# Patient Record
Sex: Male | Born: 1960 | Hispanic: No | Marital: Married | Smoking: Current every day smoker
Health system: Southern US, Community
[De-identification: ages and names within clinical notes are randomized; demographics above are authoritative.]

## PROBLEM LIST (undated history)

## (undated) DIAGNOSIS — I10 Essential (primary) hypertension: Secondary | ICD-10-CM

## (undated) DIAGNOSIS — N210 Calculus in bladder: Secondary | ICD-10-CM

## (undated) DIAGNOSIS — R319 Hematuria, unspecified: Secondary | ICD-10-CM

## (undated) DIAGNOSIS — N529 Male erectile dysfunction, unspecified: Secondary | ICD-10-CM

## (undated) DIAGNOSIS — Z87442 Personal history of urinary calculi: Secondary | ICD-10-CM

## (undated) DIAGNOSIS — R21 Rash and other nonspecific skin eruption: Secondary | ICD-10-CM

## (undated) DIAGNOSIS — N200 Calculus of kidney: Secondary | ICD-10-CM

## (undated) DIAGNOSIS — Z8679 Personal history of other diseases of the circulatory system: Secondary | ICD-10-CM

## (undated) DIAGNOSIS — R350 Frequency of micturition: Secondary | ICD-10-CM

## (undated) DIAGNOSIS — R300 Dysuria: Secondary | ICD-10-CM

## (undated) HISTORY — PX: EXTRACORPOREAL SHOCK WAVE LITHOTRIPSY: SHX1557

## (undated) HISTORY — PX: ORCHIECTOMY: SHX2116

---

## 1999-04-06 ENCOUNTER — Other Ambulatory Visit: Admission: RE | Admit: 1999-04-06 | Discharge: 1999-04-06 | Payer: Self-pay | Admitting: Gynecology

## 2002-04-09 ENCOUNTER — Ambulatory Visit (HOSPITAL_COMMUNITY): Admission: RE | Admit: 2002-04-09 | Discharge: 2002-04-09 | Payer: Self-pay | Admitting: Urology

## 2002-04-09 ENCOUNTER — Encounter: Payer: Self-pay | Admitting: Urology

## 2002-04-11 ENCOUNTER — Ambulatory Visit (HOSPITAL_BASED_OUTPATIENT_CLINIC_OR_DEPARTMENT_OTHER): Admission: RE | Admit: 2002-04-11 | Discharge: 2002-04-11 | Payer: Self-pay | Admitting: Urology

## 2002-04-11 ENCOUNTER — Encounter: Payer: Self-pay | Admitting: Urology

## 2002-05-13 ENCOUNTER — Ambulatory Visit (HOSPITAL_BASED_OUTPATIENT_CLINIC_OR_DEPARTMENT_OTHER): Admission: RE | Admit: 2002-05-13 | Discharge: 2002-05-13 | Payer: Self-pay | Admitting: Urology

## 2002-05-13 ENCOUNTER — Encounter: Payer: Self-pay | Admitting: Urology

## 2002-07-22 ENCOUNTER — Encounter: Payer: Self-pay | Admitting: Urology

## 2002-07-22 ENCOUNTER — Ambulatory Visit (HOSPITAL_COMMUNITY): Admission: RE | Admit: 2002-07-22 | Discharge: 2002-07-22 | Payer: Self-pay | Admitting: Urology

## 2002-09-25 ENCOUNTER — Other Ambulatory Visit: Admission: RE | Admit: 2002-09-25 | Discharge: 2002-09-25 | Payer: Self-pay | Admitting: Gynecology

## 2011-02-18 ENCOUNTER — Ambulatory Visit (INDEPENDENT_AMBULATORY_CARE_PROVIDER_SITE_OTHER): Payer: Self-pay

## 2011-02-18 ENCOUNTER — Inpatient Hospital Stay (INDEPENDENT_AMBULATORY_CARE_PROVIDER_SITE_OTHER)
Admission: RE | Admit: 2011-02-18 | Discharge: 2011-02-18 | Disposition: A | Discharge status: Home / Self Care | Payer: Self-pay | Source: Ambulatory Visit | Attending: Family Medicine | Admitting: Family Medicine

## 2011-02-18 DIAGNOSIS — N200 Calculus of kidney: Secondary | ICD-10-CM

## 2011-02-18 LAB — POCT URINALYSIS DIP (DEVICE)
Glucose, UA: NEGATIVE mg/dL
Ketones, ur: NEGATIVE mg/dL
Leukocytes, UA: NEGATIVE
Nitrite: NEGATIVE
Protein, ur: 30 mg/dL — AB
Specific Gravity, Urine: 1.02 (ref 1.005–1.030)
Urobilinogen, UA: >=8 mg/dL (ref 0.0–1.0)
pH: 6.5 (ref 5.0–8.0)

## 2011-02-19 ENCOUNTER — Emergency Department (HOSPITAL_COMMUNITY)
Admission: EM | Admit: 2011-02-19 | Discharge: 2011-02-19 | Disposition: A | Discharge status: Home / Self Care | Payer: Self-pay | Attending: Emergency Medicine | Admitting: Emergency Medicine

## 2011-02-19 DIAGNOSIS — Z9889 Other specified postprocedural states: Secondary | ICD-10-CM | POA: Insufficient documentation

## 2011-02-19 DIAGNOSIS — R109 Unspecified abdominal pain: Secondary | ICD-10-CM | POA: Insufficient documentation

## 2011-02-19 DIAGNOSIS — N200 Calculus of kidney: Secondary | ICD-10-CM | POA: Insufficient documentation

## 2011-02-19 DIAGNOSIS — I10 Essential (primary) hypertension: Secondary | ICD-10-CM | POA: Insufficient documentation

## 2011-02-19 DIAGNOSIS — E7800 Pure hypercholesterolemia, unspecified: Secondary | ICD-10-CM | POA: Insufficient documentation

## 2011-02-23 ENCOUNTER — Other Ambulatory Visit (HOSPITAL_COMMUNITY): Payer: Self-pay | Admitting: Urology

## 2011-02-23 DIAGNOSIS — N200 Calculus of kidney: Secondary | ICD-10-CM

## 2011-02-24 ENCOUNTER — Ambulatory Visit (HOSPITAL_COMMUNITY)
Admission: RE | Admit: 2011-02-24 | Discharge: 2011-02-24 | Disposition: A | Discharge status: Home / Self Care | Payer: Self-pay | Source: Ambulatory Visit | Attending: Urology | Admitting: Urology

## 2011-02-24 DIAGNOSIS — N200 Calculus of kidney: Secondary | ICD-10-CM | POA: Insufficient documentation

## 2011-02-24 DIAGNOSIS — K4090 Unilateral inguinal hernia, without obstruction or gangrene, not specified as recurrent: Secondary | ICD-10-CM | POA: Insufficient documentation

## 2011-02-24 DIAGNOSIS — N400 Enlarged prostate without lower urinary tract symptoms: Secondary | ICD-10-CM | POA: Insufficient documentation

## 2011-02-24 DIAGNOSIS — R109 Unspecified abdominal pain: Secondary | ICD-10-CM | POA: Insufficient documentation

## 2011-02-24 DIAGNOSIS — R509 Fever, unspecified: Secondary | ICD-10-CM | POA: Insufficient documentation

## 2011-02-24 DIAGNOSIS — R110 Nausea: Secondary | ICD-10-CM | POA: Insufficient documentation

## 2011-02-24 DIAGNOSIS — K5730 Diverticulosis of large intestine without perforation or abscess without bleeding: Secondary | ICD-10-CM | POA: Insufficient documentation

## 2011-02-24 DIAGNOSIS — N1330 Unspecified hydronephrosis: Secondary | ICD-10-CM | POA: Insufficient documentation

## 2011-02-24 DIAGNOSIS — N201 Calculus of ureter: Secondary | ICD-10-CM | POA: Insufficient documentation

## 2011-03-03 ENCOUNTER — Ambulatory Visit (HOSPITAL_COMMUNITY)
Admission: RE | Admit: 2011-03-03 | Discharge: 2011-03-03 | Disposition: A | Discharge status: Home / Self Care | Payer: Self-pay | Source: Ambulatory Visit | Attending: Urology | Admitting: Urology

## 2011-03-07 ENCOUNTER — Ambulatory Visit (HOSPITAL_COMMUNITY)
Admission: RE | Admit: 2011-03-07 | Discharge: 2011-03-07 | Disposition: A | Discharge status: Home / Self Care | Payer: Self-pay | Source: Ambulatory Visit | Attending: Urology | Admitting: Urology

## 2011-03-07 ENCOUNTER — Ambulatory Visit (HOSPITAL_COMMUNITY): Payer: Self-pay

## 2011-03-07 DIAGNOSIS — Z01818 Encounter for other preprocedural examination: Secondary | ICD-10-CM | POA: Insufficient documentation

## 2011-03-07 DIAGNOSIS — N201 Calculus of ureter: Secondary | ICD-10-CM | POA: Insufficient documentation

## 2011-03-07 DIAGNOSIS — I10 Essential (primary) hypertension: Secondary | ICD-10-CM | POA: Insufficient documentation

## 2011-03-07 DIAGNOSIS — F17200 Nicotine dependence, unspecified, uncomplicated: Secondary | ICD-10-CM | POA: Insufficient documentation

## 2011-03-07 DIAGNOSIS — E7800 Pure hypercholesterolemia, unspecified: Secondary | ICD-10-CM | POA: Insufficient documentation

## 2011-03-07 DIAGNOSIS — Z79899 Other long term (current) drug therapy: Secondary | ICD-10-CM | POA: Insufficient documentation

## 2011-03-07 DIAGNOSIS — Z7982 Long term (current) use of aspirin: Secondary | ICD-10-CM | POA: Insufficient documentation

## 2012-09-03 IMAGING — CT CT ABD-PELV W/O CM
2 of 3 series · 17 of 46 positions shown, 19 images · non-contrast
Comparison: None

CLINICAL DATA: Left flank pain for 1.5 weeks, nausea, fever,
question kidney stones

CT ABDOMEN AND PELVIS WITHOUT CONTRAST
TECHNIQUE: Multidetector CT imaging of the abdomen and pelvis was
performed following the standard protocol without intravenous
contrast. Sagittal and coronal MPR images reconstructed from axial
data set.

[Series 2: under 200# stone no prev · axial · 0.71mm/px · z∈[-374,+40]mm · 14 of 97 slices shown, 16 images]
[im 7/97  soft-tissue]
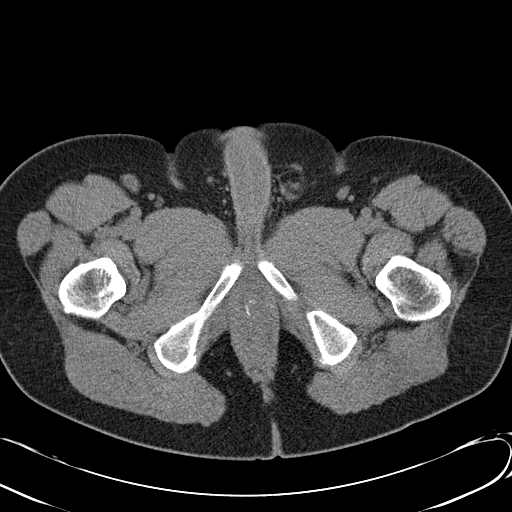
[im 7/97  bone]
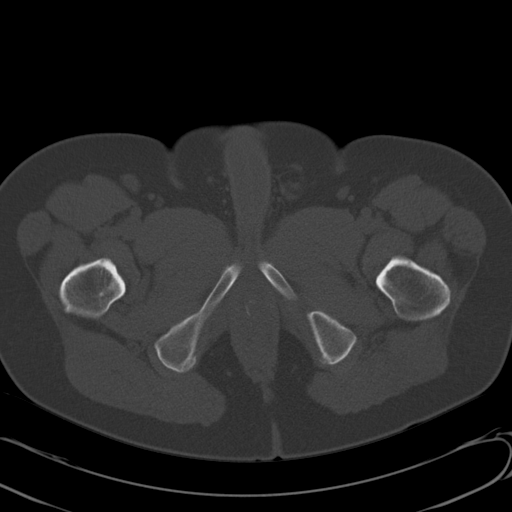
[im 13/97  soft-tissue]
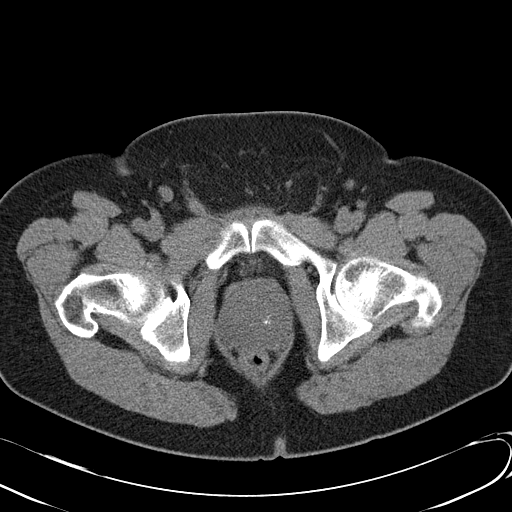
[im 19/97  soft-tissue]
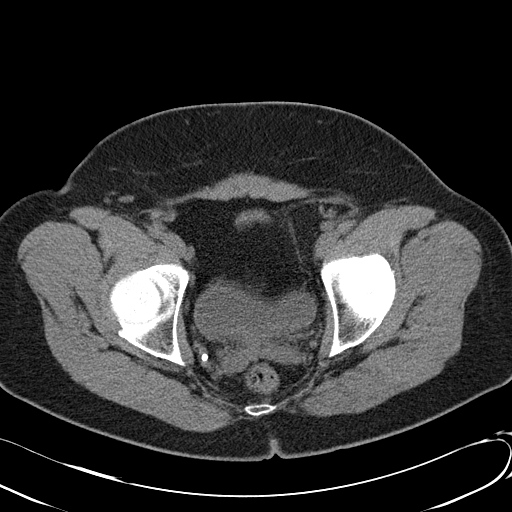
[im 25/97  soft-tissue]
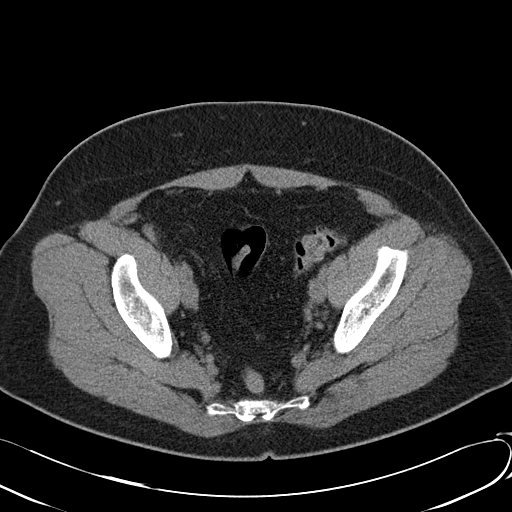
[im 31/97  soft-tissue]
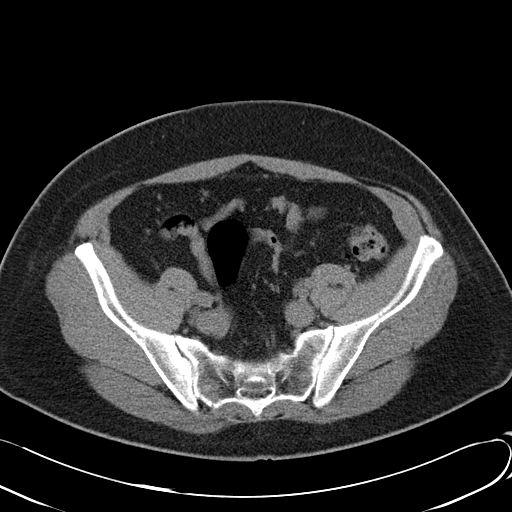
[im 38/97  soft-tissue]
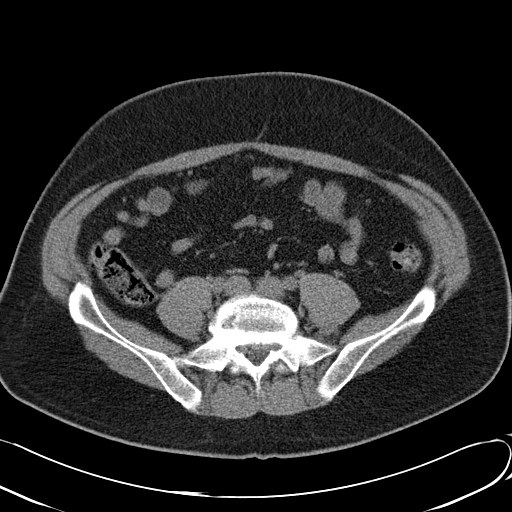
[im 44/97  soft-tissue]
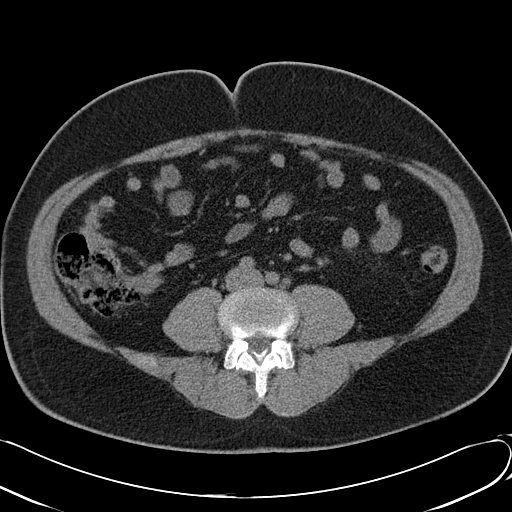
[im 53/97  soft-tissue]
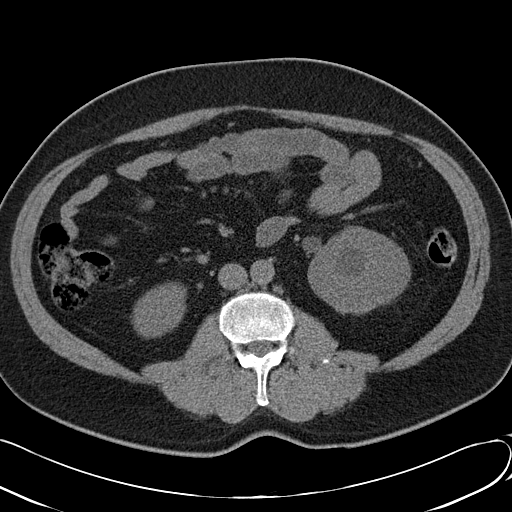
[im 59/97  soft-tissue]
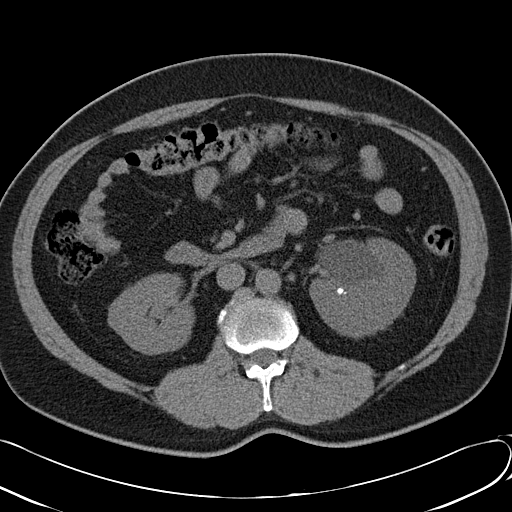
[im 59/97  bone]
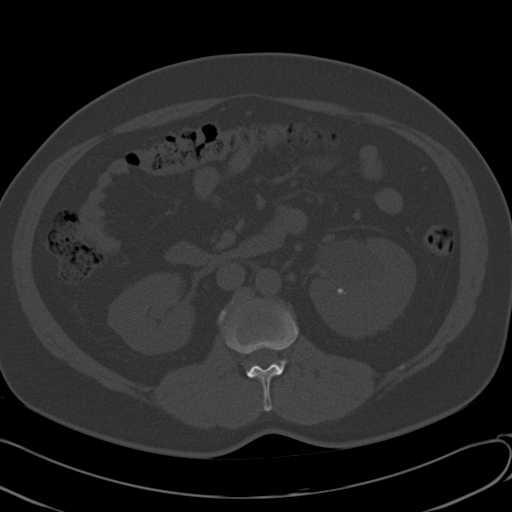
[im 66/97  soft-tissue]
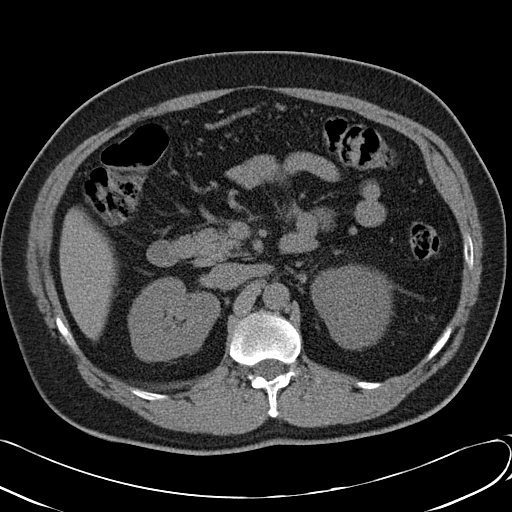
[im 72/97  soft-tissue]
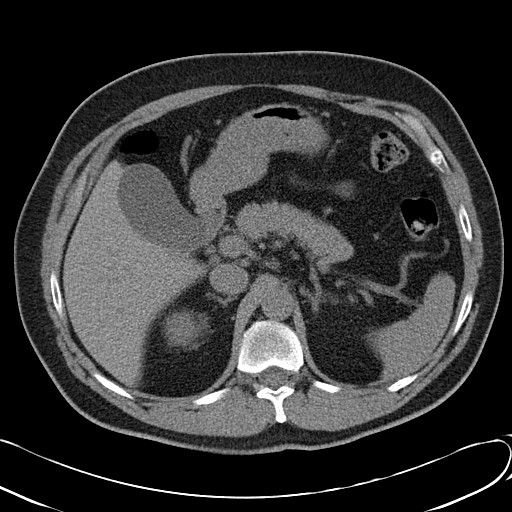
[im 78/97  soft-tissue]
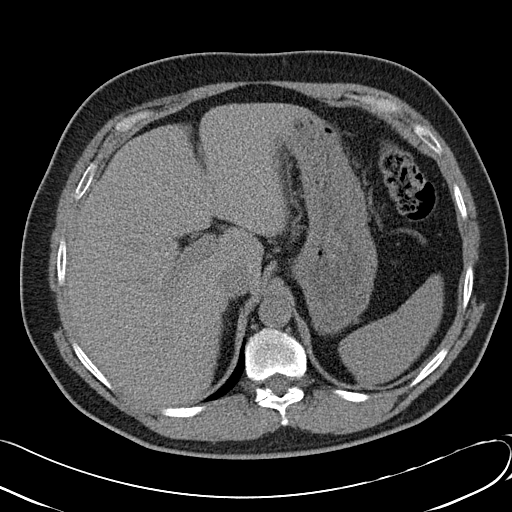
[im 84/97  soft-tissue]
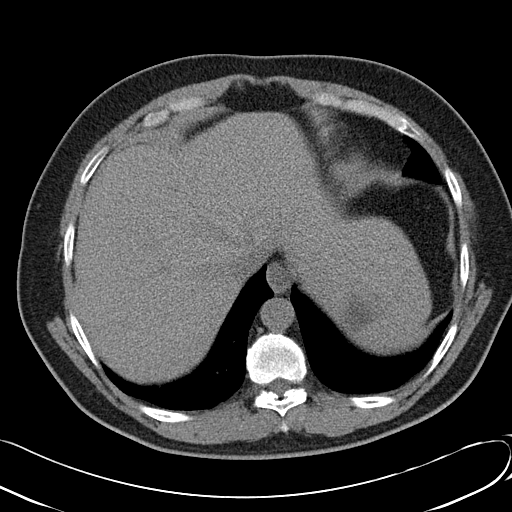
[im 90/97  soft-tissue]
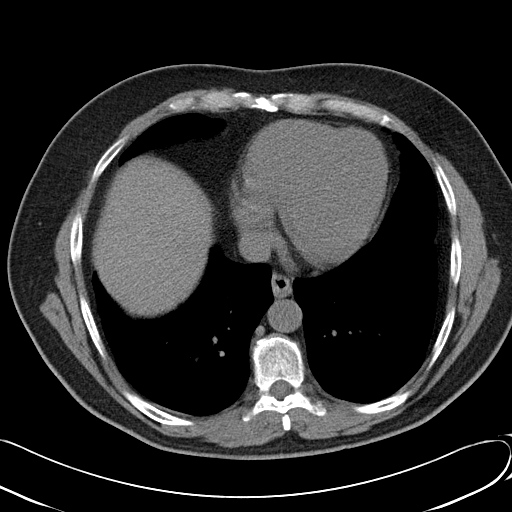

[Series 602: <mpr thick range> · coronal · 0.94mm/px · 3 of 94 slices shown]
[im 32/94  soft-tissue]
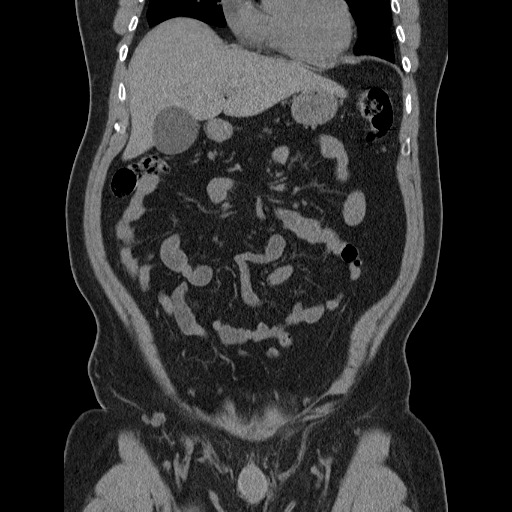
[im 42/94  soft-tissue]
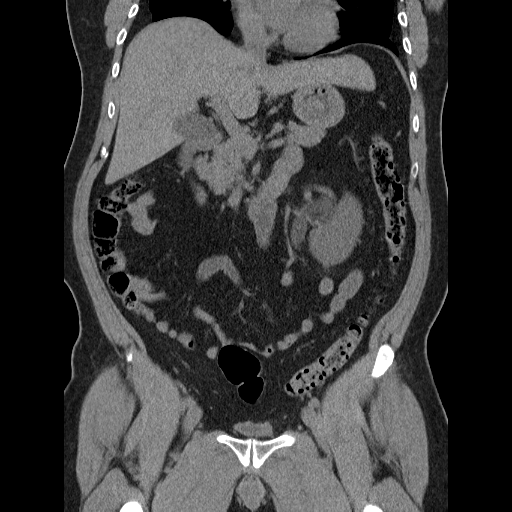
[im 52/94  soft-tissue]
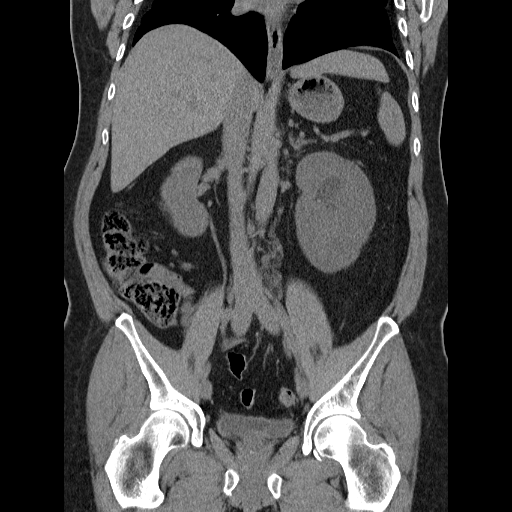

[17 of 46 positions shown; findings below may reference images not displayed]

FINDINGS: Lung bases clear.
Numerous bilateral renal calculi up to 9 mm diameter at lower pole
right kidney.
Left hydronephrosis secondary to a 7 x 5 mm diameter 11 mm length
very proximal left ureteral calculus.
No ureteral dilatation or right hydronephrosis.
Prostatic enlargement, gland measuring 5.3 x 4.7 cm image 85,
cm length.
Low attenuation focus within medial mid to upper right kidney,
x 1.1 cm image 33, likely small cyst.
No focal bladder abnormalities.

Within limits of a nonenhanced exam, liver, spleen, pancreas, and
adrenal glands normal.
Appendix normal appearance on coronal images.
Diverticulosis of descending and sigmoid colon without
diverticulitis.
Left inguinal hernia containing fat.
Stomach and bowel loops otherwise unremarkable.
No mass, adenopathy, free fluid, or inflammatory process.
No acute osseous findings.
IMPRESSION: Numerous bilateral renal calculi.
Left hydronephrosis secondary to an 11 x 7 x 5 mm calculus in the
very proximal left ureter.
Left inguinal hernia containing fat.
Probable small right renal cyst.
Descending and sigmoid colonic diverticulosis.
Prostatic enlargement.

## 2013-03-28 ENCOUNTER — Ambulatory Visit: Payer: Self-pay | Admitting: Family Medicine

## 2013-04-04 ENCOUNTER — Encounter: Payer: Self-pay | Admitting: Family Medicine

## 2013-04-04 ENCOUNTER — Ambulatory Visit (INDEPENDENT_AMBULATORY_CARE_PROVIDER_SITE_OTHER): Payer: No Typology Code available for payment source | Admitting: Family Medicine

## 2013-04-04 VITALS — BP 160/98 | HR 85 | Temp 98.3°F | Resp 16 | Ht 66.0 in | Wt 191.8 lb

## 2013-04-04 DIAGNOSIS — I10 Essential (primary) hypertension: Secondary | ICD-10-CM

## 2013-04-04 DIAGNOSIS — R21 Rash and other nonspecific skin eruption: Secondary | ICD-10-CM

## 2013-04-04 DIAGNOSIS — N529 Male erectile dysfunction, unspecified: Secondary | ICD-10-CM

## 2013-04-04 DIAGNOSIS — Z23 Encounter for immunization: Secondary | ICD-10-CM

## 2013-04-04 DIAGNOSIS — Z0000 Encounter for general adult medical examination without abnormal findings: Secondary | ICD-10-CM

## 2013-04-04 LAB — CBC
HCT: 45.1 % (ref 39.0–52.0)
Hemoglobin: 15.6 g/dL (ref 13.0–17.0)
MCH: 29.2 pg (ref 26.0–34.0)
MCHC: 34.6 g/dL (ref 30.0–36.0)
MCV: 84.3 fL (ref 78.0–100.0)
Platelets: 240 10*3/uL (ref 150–400)
RBC: 5.35 MIL/uL (ref 4.22–5.81)
RDW: 13.7 % (ref 11.5–15.5)
WBC: 9 10*3/uL (ref 4.0–10.5)

## 2013-04-04 LAB — LIPID PANEL
Cholesterol: 238 mg/dL — ABNORMAL HIGH (ref 0–200)
HDL: 52 mg/dL (ref >39)
LDL Cholesterol: 141 mg/dL — ABNORMAL HIGH (ref 0–99)
Total CHOL/HDL Ratio: 4.6 Ratio
Triglycerides: 223 mg/dL — ABNORMAL HIGH (ref <150)
VLDL: 45 mg/dL — ABNORMAL HIGH (ref 0–40)

## 2013-04-04 LAB — COMPREHENSIVE METABOLIC PANEL
ALT: 30 U/L (ref 0–53)
AST: 21 U/L (ref 0–37)
Albumin: 4.5 g/dL (ref 3.5–5.2)
Alkaline Phosphatase: 88 U/L (ref 39–117)
BUN: 17 mg/dL (ref 6–23)
CO2: 25 mEq/L (ref 19–32)
Calcium: 9.5 mg/dL (ref 8.4–10.5)
Chloride: 106 mEq/L (ref 96–112)
Creat: 0.74 mg/dL (ref 0.50–1.35)
Glucose, Bld: 93 mg/dL (ref 70–99)
Potassium: 4.1 mEq/L (ref 3.5–5.3)
Sodium: 139 mEq/L (ref 135–145)
Total Bilirubin: 0.4 mg/dL (ref 0.3–1.2)
Total Protein: 7.7 g/dL (ref 6.0–8.3)

## 2013-04-04 MED ORDER — KETOCONAZOLE 2 % EX CREA: TOPICAL_CREAM | Freq: Every day | CUTANEOUS | Status: DC

## 2013-04-04 MED ORDER — IVERMECTIN 3 MG PO TABS: 3.0000 mg | ORAL_TABLET | Freq: Once | ORAL | Status: DC

## 2013-04-04 MED ORDER — TRIAMCINOLONE ACETONIDE 0.5 % EX CREA: TOPICAL_CREAM | Freq: Two times a day (BID) | CUTANEOUS | Status: DC

## 2013-04-04 MED ORDER — AMLODIPINE BESYLATE 5 MG PO TABS: 5.0000 mg | ORAL_TABLET | Freq: Every day | ORAL | Status: DC

## 2013-04-04 MED ORDER — METHYLPREDNISOLONE (PAK) 4 MG PO TABS: ORAL_TABLET | ORAL | Status: DC

## 2013-04-04 MED ORDER — SILDENAFIL CITRATE 100 MG PO TABS: 50.0000 mg | ORAL_TABLET | Freq: Every day | ORAL | Status: DC | PRN

## 2013-04-04 NOTE — Progress Notes (Signed)
52 yo man with 5 years of rash, responds to kenalog 0.5% cream bid.  Very itchy, chest, arms, abdomen, groin extremities. This but a problem for the last 5 years and has been controlled by the Kenalog cream.  [employer] on [address].  Objective:  Patient is alert and in no acute distress but is inappropriately friendly Diffuse excoriated rash, especially the sternum. Both forearms reveals multiple papules and excoriations. Examination of the scrotum reveals some increased pigmentation which extends into the groin and perineum  Patient has an uncircumcised penis and only one testicle, having had right orchiectomy in the past.  Assessment: This chronic rash may in fact be scabies. It is unusual that his wife has had no rash.  Need for prophylactic vaccination and inoculation against influenza - Plan: Flu Vaccine QUAD 36+ mos IM  Rash - Plan: CBC, methylPREDNIsolone (MEDROL DOSPACK) 4 MG tablet, ivermectin (STROMECTOL) 3 MG TABS tablet, ketoconazole (NIZORAL) 2 % cream  Annual physical exam - Plan: CBC, Comprehensive metabolic panel, Lipid panel, PSA  Hypertension - Plan: amLODipine (NORVASC) 5 MG tablet  Erectile dysfunction - Plan: sildenafil (VIAGRA) 100 MG tablet  Follow up BP and rash in one month  Signed, Elvina Sidle, MD

## 2013-04-05 LAB — PSA: PSA: 2.33 ng/mL (ref <=4.00)

## 2013-04-25 ENCOUNTER — Ambulatory Visit (INDEPENDENT_AMBULATORY_CARE_PROVIDER_SITE_OTHER): Payer: No Typology Code available for payment source | Admitting: Family Medicine

## 2013-04-25 ENCOUNTER — Encounter: Payer: Self-pay | Admitting: Family Medicine

## 2013-04-25 VITALS — BP 171/95 | HR 79 | Temp 98.5°F | Resp 16 | Ht 66.5 in | Wt 190.6 lb

## 2013-04-25 DIAGNOSIS — R21 Rash and other nonspecific skin eruption: Secondary | ICD-10-CM

## 2013-04-25 DIAGNOSIS — Z0000 Encounter for general adult medical examination without abnormal findings: Secondary | ICD-10-CM

## 2013-04-25 DIAGNOSIS — I10 Essential (primary) hypertension: Secondary | ICD-10-CM

## 2013-04-25 LAB — CBC WITH DIFFERENTIAL/PLATELET
Basophils Absolute: 0 10*3/uL (ref 0.0–0.1)
Basophils Relative: 1 % (ref 0–1)
Eosinophils Absolute: 0.3 10*3/uL (ref 0.0–0.7)
Eosinophils Relative: 4 % (ref 0–5)
HCT: 44.5 % (ref 39.0–52.0)
Hemoglobin: 15.3 g/dL (ref 13.0–17.0)
Lymphocytes Relative: 26 % (ref 12–46)
Lymphs Abs: 2.2 10*3/uL (ref 0.7–4.0)
MCH: 29.1 pg (ref 26.0–34.0)
MCHC: 34.4 g/dL (ref 30.0–36.0)
MCV: 84.8 fL (ref 78.0–100.0)
Monocytes Absolute: 0.5 10*3/uL (ref 0.1–1.0)
Monocytes Relative: 6 % (ref 3–12)
Neutro Abs: 5.3 10*3/uL (ref 1.7–7.7)
Neutrophils Relative %: 63 % (ref 43–77)
Platelets: 224 10*3/uL (ref 150–400)
RBC: 5.25 MIL/uL (ref 4.22–5.81)
RDW: 14 % (ref 11.5–15.5)
WBC: 8.3 10*3/uL (ref 4.0–10.5)

## 2013-04-25 LAB — IFOBT (OCCULT BLOOD): IFOBT: NEGATIVE

## 2013-04-25 MED ORDER — TRIAMCINOLONE ACETONIDE 0.5 % EX CREA: TOPICAL_CREAM | Freq: Two times a day (BID) | CUTANEOUS | Status: DC

## 2013-04-25 MED ORDER — LOSARTAN POTASSIUM-HCTZ 100-12.5 MG PO TABS: 1.0000 | ORAL_TABLET | Freq: Every day | ORAL | Status: DC

## 2013-04-25 NOTE — Progress Notes (Signed)
Patient ID: Jackson Sullivan MRN: [id number], DOB: February 08, 1961 52 y.o. Date of Encounter: 04/25/2013, 11:49 AM  Primary Physician: No primary provider on file.  Chief Complaint: Physical (CPE)  HPI: 52 y.o. y/o male with history noted below here for CPE.  Doing well. Rash is improving in groin and lower extremities but persists on forearms.  Patient works at a [employer] on [employer]. His wife works in a [employer] and does [employer]. He has a daughter who is age 32 and is doing well in school.  Review of Systems: Consitutional: No fever, chills, fatigue, night sweats, lymphadenopathy, or weight changes. Eyes: No visual changes, eye redness, or discharge. ENT/Mouth: Ears: No otalgia, tinnitus, hearing loss, discharge. Nose: No congestion, rhinorrhea, sinus pain, or epistaxis. Throat: No sore throat, post nasal drip, or teeth pain. Cardiovascular: No CP, palpitations, diaphoresis, DOE, edema, orthopnea, PND. Respiratory: No cough, hemoptysis, SOB, or wheezing. Gastrointestinal: No anorexia, dysphagia, reflux, pain, nausea, vomiting, hematemesis, diarrhea, constipation, BRBPR, or melena. Genitourinary: No dysuria, frequency, urgency, hematuria, incontinence, nocturia, decreased urinary stream, discharge, impotence, or testicular pain/masses. Musculoskeletal: No decreased ROM, myalgias, stiffness, joint swelling, or weakness. Skin: No erythema, lesion changes, pain, warmth, jaundice, or pruritis. Neurological: No headache, dizziness, syncope, seizures, tremors, memory loss, coordination problems, or paresthesias. Psychological: No anxiety, depression, hallucinations, SI/HI. Endocrine: No fatigue, polydipsia, polyphagia, polyuria, or known diabetes. All other systems were reviewed and are otherwise negative.  No past medical history on file.   No past surgical history on file.  Home Meds:  Prior to Admission medications   Medication Sig Start Date End Date  Taking? Authorizing Provider  amLODipine (NORVASC) 5 MG tablet Take 1 tablet (5 mg total) by mouth daily. 04/04/13  Yes Elvina Sidle, MD  aspirin 81 MG tablet Take 81 mg by mouth daily.   Yes Historical Provider, MD  ivermectin (STROMECTOL) 3 MG TABS tablet Take 1 tablet (3 mg total) by mouth once. 04/04/13  Yes Elvina Sidle, MD  ketoconazole (NIZORAL) 2 % cream Apply topically daily. 04/04/13  Yes Elvina Sidle, MD  sildenafil (VIAGRA) 100 MG tablet Take 0.5-1 tablets (50-100 mg total) by mouth daily as needed for erectile dysfunction. 04/04/13  Yes Elvina Sidle, MD  triamcinolone cream (KENALOG) 0.5 % Apply topically 2 (two) times daily. 04/04/13  Yes Elvina Sidle, MD  methylPREDNIsolone (MEDROL DOSPACK) 4 MG tablet follow package directions 04/04/13   Elvina Sidle, MD    Allergies: No Known Allergies  History   Social History  . Marital Status: Married    Spouse Name: N/A    Number of Children: N/A  . Years of Education: N/A   Occupational History  . Not on file.   Social History Main Topics  . Smoking status: Current Every Day Smoker    Types: Cigarettes  . Smokeless tobacco: Not on file  . Alcohol Use: Not on file  . Drug Use: Not on file  . Sexual Activity: Not on file   Other Topics Concern  . Not on file   Social History Narrative  . No narrative on file    No family history on file.  Physical Exam: Blood pressure 171/95, pulse 79, temperature 98.5 F (36.9 C), temperature source Oral, resp. rate 16, height 5' 6.5" (1.689 m), weight 190 lb 9.6 oz (86.456 kg), SpO2 99.00%.  General: Well developed, well nourished, in no acute distress. HEENT: Normocephalic, atraumatic. Conjunctiva pink, sclera non-icteric. Pupils 2 mm constricting to 1 mm, round, regular, and equally reactive  to light and accomodation. EOMI. Internal auditory canal clear. TMs with good cone of light and without pathology. Nasal mucosa pink. Nares are without discharge. No sinus  tenderness. Oral mucosa pink. Dentition fair. Pharynx without exudate.   Neck: Supple. Trachea midline. No thyromegaly. Full ROM. No lymphadenopathy. Lungs: Clear to auscultation bilaterally without wheezes, rales, or rhonchi. Breathing is of normal effort and unlabored. Cardiovascular: RRR with S1 S2. No murmurs, rubs, or gallops appreciated. Distal pulses 2+ symmetrically. No carotid or abdominal bruits Abdomen: Soft, non-tender, non-distended with normoactive bowel sounds. No hepatosplenomegaly or masses. No rebound/guarding. No CVA tenderness. Without hernias.  Rectal: No external hemorrhoids or fissures. Rectal vault without masses.  Genitourinary:  un circumcised male. No penile lesions. Testes descended bilaterally, and smooth without tenderness or masses.  Musculoskeletal: Full range of motion and 5/5 strength throughout. Without swelling, atrophy, tenderness, crepitus, or warmth. Extremities without clubbing, cyanosis, or edema. Calves supple. Skin: Warm and moist without erythema, ecchymosis, wounds, or rash. Neuro: A+Ox3. CN II-XII grossly intact. Moves all extremities spontaneously. Full sensation throughout. Normal gait. DTR 2+ throughout upper and lower extremities. Finger to nose intact. Psych:  Responds to questions appropriately with a normal affect.   Studies  UA:   Assessment/Plan:  52 y.o. y/o  male here for CPE Annual physical exam - Plan: CBC with Differential, Comprehensive metabolic panel, Lipid panel, PSA, Ambulatory referral to Gastroenterology, IFOBT POC (occult bld, rslt in office), CANCELED: POCT urinalysis dipstick  Rash - Plan: HIV Antibody, Ambulatory referral to Dermatology, triamcinolone cream (KENALOG) 0.5 %  Hypertension - Plan: losartan-hydrochlorothiazide (HYZAAR) 100-12.5 MG per tablet  Signed, Elvina Sidle, MD 04/25/2013 11:49 AM

## 2013-04-25 NOTE — Patient Instructions (Signed)

## 2013-04-26 LAB — COMPREHENSIVE METABOLIC PANEL
ALT: 26 U/L (ref 0–53)
AST: 20 U/L (ref 0–37)
Albumin: 4 g/dL (ref 3.5–5.2)
Alkaline Phosphatase: 84 U/L (ref 39–117)
BUN: 15 mg/dL (ref 6–23)
CO2: 25 mEq/L (ref 19–32)
Calcium: 9 mg/dL (ref 8.4–10.5)
Chloride: 106 mEq/L (ref 96–112)
Creat: 0.72 mg/dL (ref 0.50–1.35)
Glucose, Bld: 110 mg/dL — ABNORMAL HIGH (ref 70–99)
Potassium: 3.8 mEq/L (ref 3.5–5.3)
Sodium: 138 mEq/L (ref 135–145)
Total Bilirubin: 0.5 mg/dL (ref 0.3–1.2)
Total Protein: 6.9 g/dL (ref 6.0–8.3)

## 2013-04-26 LAB — LIPID PANEL
Cholesterol: 245 mg/dL — ABNORMAL HIGH (ref 0–200)
HDL: 57 mg/dL (ref >39)
LDL Cholesterol: 158 mg/dL — ABNORMAL HIGH (ref 0–99)
Total CHOL/HDL Ratio: 4.3 Ratio
Triglycerides: 150 mg/dL — ABNORMAL HIGH (ref <150)
VLDL: 30 mg/dL (ref 0–40)

## 2013-04-26 LAB — PSA: PSA: 2.49 ng/mL (ref <=4.00)

## 2013-04-26 LAB — HIV ANTIBODY (ROUTINE TESTING W REFLEX): HIV: NONREACTIVE

## 2013-05-14 ENCOUNTER — Other Ambulatory Visit: Payer: Self-pay | Admitting: Gastroenterology

## 2013-05-17 ENCOUNTER — Encounter (HOSPITAL_COMMUNITY): Payer: Self-pay | Admitting: *Deleted

## 2013-05-17 ENCOUNTER — Ambulatory Visit (HOSPITAL_COMMUNITY)
Admission: RE | Admit: 2013-05-17 | Discharge: 2013-05-17 | Disposition: A | Discharge status: Home / Self Care | Payer: No Typology Code available for payment source | Source: Ambulatory Visit | Attending: Gastroenterology | Admitting: Gastroenterology

## 2013-05-17 ENCOUNTER — Encounter (HOSPITAL_COMMUNITY)
Admission: RE | Disposition: A | Discharge status: Home / Self Care | Payer: Self-pay | Source: Ambulatory Visit | Attending: Gastroenterology

## 2013-05-17 DIAGNOSIS — K644 Residual hemorrhoidal skin tags: Secondary | ICD-10-CM | POA: Insufficient documentation

## 2013-05-17 DIAGNOSIS — D126 Benign neoplasm of colon, unspecified: Secondary | ICD-10-CM | POA: Insufficient documentation

## 2013-05-17 DIAGNOSIS — K648 Other hemorrhoids: Secondary | ICD-10-CM | POA: Insufficient documentation

## 2013-05-17 DIAGNOSIS — F17200 Nicotine dependence, unspecified, uncomplicated: Secondary | ICD-10-CM | POA: Insufficient documentation

## 2013-05-17 DIAGNOSIS — I10 Essential (primary) hypertension: Secondary | ICD-10-CM | POA: Insufficient documentation

## 2013-05-17 DIAGNOSIS — Z1211 Encounter for screening for malignant neoplasm of colon: Secondary | ICD-10-CM | POA: Insufficient documentation

## 2013-05-17 HISTORY — PX: COLONOSCOPY: SHX5424

## 2013-05-17 HISTORY — DX: Essential (primary) hypertension: I10

## 2013-05-17 SURGERY — COLONOSCOPY
Anesthesia: Moderate Sedation

## 2013-05-17 MED ORDER — SODIUM CHLORIDE 0.9 % IV SOLN
INTRAVENOUS | Status: DC
  Administered 2013-05-17: 500 mL via INTRAVENOUS

## 2013-05-17 MED ORDER — MIDAZOLAM HCL 5 MG/5ML IJ SOLN
INTRAMUSCULAR | Status: DC | PRN
  Administered 2013-05-17 (×4): 2.5 mg via INTRAVENOUS

## 2013-05-17 MED ORDER — FENTANYL CITRATE 0.05 MG/ML IJ SOLN
INTRAMUSCULAR | Status: AC
  Filled 2013-05-17: qty 4

## 2013-05-17 MED ORDER — MIDAZOLAM HCL 10 MG/2ML IJ SOLN
INTRAMUSCULAR | Status: AC
  Filled 2013-05-17: qty 4

## 2013-05-17 MED ORDER — DIPHENHYDRAMINE HCL 50 MG/ML IJ SOLN
INTRAMUSCULAR | Status: AC
  Filled 2013-05-17: qty 1

## 2013-05-17 MED ORDER — FENTANYL CITRATE 0.05 MG/ML IJ SOLN
INTRAMUSCULAR | Status: DC | PRN
  Administered 2013-05-17 (×4): 25 ug via INTRAVENOUS

## 2013-05-17 NOTE — Op Note (Signed)
[facility] [address] [place], [address]   OPERATIVE PROCEDURE REPORT  PATIENT: Jackson Sullivan, Jackson Sullivan  MR#: [id number] BIRTHDATE: 1961/05/11  GENDER: Male ENDOSCOPIST: Jeani Hawking, MD ASSISTANT:   Dorisann Frames, technician Claudie Revering, RN CGRN PROCEDURE DATE: 05/17/2013 PROCEDURE:   Colonoscopy, screening ASA CLASS:   Class II INDICATIONS:Screening MEDICATIONS: Versed 10 mg IV and Fentanyl 100 mcg IV  DESCRIPTION OF PROCEDURE:   After the risks benefits and alternatives of the procedure were thoroughly explained, informed consent was obtained.  A digital rectal exam revealed no abnormalities of the rectum.    The     endoscope was introduced through the anus  and advanced to the cecum, which was identified by both the appendix and ileocecal valve , No adverse events experienced.    The quality of the prep was excellent. .  The instrument was then slowly withdrawn as the colon was fully examined.   FINDINGS: Three polyps were identified in the cecum, transverse colon, and sigmoid colon.  The polyps meausred 3 mm and all were removed with a cold snare.  No evidence of any masses, inflammation, ulcerations, erosions, or vascular abnormalities. The scope was then withdrawn from the patient and the procedure terminated.  COMPLICATIONS: There were no complications.  IMPRESSION: 1) Polyps. 2) Int/Ext Hemorrhoids.  RECOMMENDATIONS: 1) Await biopsy results. 2) Repeat the colonoscopy in 3-5 years.  _______________________________ eSignedJeani Hawking, MD 05/17/2013 11:38 AM

## 2013-05-17 NOTE — H&P (Signed)
Reason for Consult: Screening colonoscopy Referring Physician: Elvina Sidle, M.D.  Nagi Awan HPI: This is a 51 year old male who is referred for a screening colonoscopy.  He denies any issues with his colon, but he does report having some chest pain.  In the office, it was felt that his chest pain was not cardiac.  No issues with his chest pain with exertion and it was not long lasting.  Even though it does not appear that he has cardiac chest pain, the decision was made to bring him to the hospital.  Past Medical History  Diagnosis Date  . Hypertension   . Fungal infection     Past Surgical History  Procedure Laterality Date  . Testicle surgery      History reviewed. No pertinent family history.  Social History:  reports that he has been smoking Cigarettes.  He has been smoking about 0.00 packs per day. He does not have any smokeless tobacco history on file. He reports that he does not drink alcohol or use illicit drugs.  Allergies: No Known Allergies  Medications:  Scheduled:  Continuous: . sodium chloride      No results found for this or any previous visit (from the past 24 hour(s)).   No results found.  ROS:  As stated above in the HPI otherwise negative.  There were no vitals taken for this visit.    PE: Gen: NAD, Alert and Oriented HEENT:  [place]/AT, EOMI Neck: Supple, no LAD Lungs: CTA Bilaterally CV: RRR without M/G/R ABM: Soft, NTND, +BS Ext: No C/C/E  Assessment/Plan: 1) Screening colonoscopy today.  Imir Brumbach D 05/17/2013, 10:08 AM

## 2013-05-20 ENCOUNTER — Encounter (HOSPITAL_COMMUNITY): Payer: Self-pay | Admitting: Gastroenterology

## 2013-08-08 ENCOUNTER — Ambulatory Visit (INDEPENDENT_AMBULATORY_CARE_PROVIDER_SITE_OTHER): Payer: No Typology Code available for payment source | Admitting: Family Medicine

## 2013-08-08 ENCOUNTER — Encounter: Payer: Self-pay | Admitting: Family Medicine

## 2013-08-08 VITALS — BP 120/82 | HR 62 | Temp 98.3°F | Resp 16 | Ht 65.0 in | Wt 192.0 lb

## 2013-08-08 DIAGNOSIS — R21 Rash and other nonspecific skin eruption: Secondary | ICD-10-CM

## 2013-08-08 DIAGNOSIS — N529 Male erectile dysfunction, unspecified: Secondary | ICD-10-CM

## 2013-08-08 MED ORDER — TRIAMCINOLONE ACETONIDE 0.5 % EX CREA: TOPICAL_CREAM | Freq: Two times a day (BID) | CUTANEOUS | Status: DC

## 2013-08-08 NOTE — Progress Notes (Signed)
° °  Subjective:  This chart was scribed for Robyn Haber, MD by Donato Schultz, Medical Scribe. This patient was seen in Room 27 and the patient's care was started at 10:58 AM.   Patient ID: Jackson Sullivan, male    DOB: 13-Jun-1960, 53 y.o.   MRN: [id number]  HPI HPI Comments: Jackson Sullivan is a 53 y.o. male who presents to the [facility] for a follow-up visit regarding a rash on his arms bilaterally and bilateral legs.  He states that the rash has improved but is still painful.  The patient states that the pharmacy gave him the 0.1% container of Kenalog cream but he would like the 1% instead.  The patient states that he works at a [employer] on [address].      Past Medical History  Diagnosis Date   Hypertension    Fungal infection    Past Surgical History  Procedure Laterality Date   Testicle surgery     Colonoscopy N/A 05/17/2013    Procedure: COLONOSCOPY;  Surgeon: Beryle Beams, MD;  Location: [facility];  Service: Endoscopy;  Laterality: N/A;   No family history on file. History   Social History   Marital Status: Married    Spouse Name: N/A    Number of Children: N/A   Years of Education: N/A   Occupational History   Not on file.   Social History Main Topics   Smoking status: Current Every Day Smoker    Types: Cigarettes   Smokeless tobacco: Not on file   Alcohol Use: No   Drug Use: No   Sexual Activity: Not on file   Other Topics Concern   Not on file   Social History Narrative   No narrative on file   No Known Allergies  Review of Systems  Skin: Positive for rash.  All other systems reviewed and are negative.     Objective:  Physical Exam  Nursing note and vitals reviewed. Constitutional: He is oriented to person, place, and time. He appears well-developed and well-nourished.  HENT:  Head: Normocephalic and atraumatic.  Eyes: EOM are normal.  Neck: Normal range of motion.  Cardiovascular: Normal  rate, regular rhythm and normal heart sounds.  Exam reveals no gallop and no friction rub.   No murmur heard. Pulmonary/Chest: Effort normal and breath sounds normal.  Musculoskeletal: Normal range of motion.  Neurological: He is alert and oriented to person, place, and time.  Skin: Skin is warm and dry.  Healing hyperpigmented areas on forearms and shins. There is some fresh excoriations on the left shin but otherwise the skin is healing.  Psychiatric: He has a normal mood and affect. His behavior is normal.       There were no vitals taken for this visit. Assessment & Plan:   Rash - Plan: triamcinolone cream (KENALOG) 0.5 %  Erectile dysfunction I will try to obtain some coupons for the patient. Signed, Robyn Haber, MD   I personally performed the services described in this documentation, which was scribed in my presence. The recorded information has been reviewed and is accurate.

## 2013-08-09 ENCOUNTER — Telehealth: Payer: Self-pay

## 2013-08-09 MED ORDER — TRIAMCINOLONE ACETONIDE 0.1 % EX CREA: 1.0000 "application " | TOPICAL_CREAM | Freq: Two times a day (BID) | CUTANEOUS | Status: DC

## 2013-08-09 NOTE — Telephone Encounter (Signed)
Dr. Carlean Jews, pharmacy called and reported that pt states that he wants the 0.1% Kenalog cream, not the 0.5% which is what pt has always been on ( OV notes say that pt reported he has been using the 0.1% and pt wants 1% which pharm states is not made). 0.5% is what was sent in at [place]. Please advise.

## 2013-09-10 ENCOUNTER — Other Ambulatory Visit: Payer: Self-pay | Admitting: Dermatology

## 2014-02-13 ENCOUNTER — Encounter: Payer: Self-pay | Admitting: Family Medicine

## 2014-02-13 ENCOUNTER — Ambulatory Visit (INDEPENDENT_AMBULATORY_CARE_PROVIDER_SITE_OTHER): Payer: No Typology Code available for payment source | Admitting: Family Medicine

## 2014-02-13 VITALS — BP 149/85 | HR 75 | Temp 97.6°F | Resp 16 | Ht 66.75 in | Wt 186.4 lb

## 2014-02-13 DIAGNOSIS — R319 Hematuria, unspecified: Secondary | ICD-10-CM

## 2014-02-13 DIAGNOSIS — R21 Rash and other nonspecific skin eruption: Secondary | ICD-10-CM

## 2014-02-13 DIAGNOSIS — N200 Calculus of kidney: Secondary | ICD-10-CM

## 2014-02-13 DIAGNOSIS — I10 Essential (primary) hypertension: Secondary | ICD-10-CM

## 2014-02-13 DIAGNOSIS — N529 Male erectile dysfunction, unspecified: Secondary | ICD-10-CM

## 2014-02-13 LAB — COMPREHENSIVE METABOLIC PANEL
ALT: 23 U/L (ref 0–53)
AST: 22 U/L (ref 0–37)
Albumin: 4.9 g/dL (ref 3.5–5.2)
Alkaline Phosphatase: 85 U/L (ref 39–117)
BUN: 22 mg/dL (ref 6–23)
CO2: 27 mEq/L (ref 19–32)
Calcium: 9.9 mg/dL (ref 8.4–10.5)
Chloride: 101 mEq/L (ref 96–112)
Creat: 0.86 mg/dL (ref 0.50–1.35)
Glucose, Bld: 107 mg/dL — ABNORMAL HIGH (ref 70–99)
Potassium: 4.2 mEq/L (ref 3.5–5.3)
Sodium: 138 mEq/L (ref 135–145)
Total Bilirubin: 0.5 mg/dL (ref 0.2–1.2)
Total Protein: 8.3 g/dL (ref 6.0–8.3)

## 2014-02-13 LAB — POCT URINALYSIS DIPSTICK
Bilirubin, UA: NEGATIVE
Glucose, UA: NEGATIVE
Ketones, UA: NEGATIVE
Leukocytes, UA: NEGATIVE
Nitrite, UA: NEGATIVE
Protein, UA: 30
Spec Grav, UA: 1.02
Urobilinogen, UA: 1
pH, UA: 6

## 2014-02-13 LAB — LIPID PANEL
Cholesterol: 252 mg/dL — ABNORMAL HIGH (ref 0–200)
HDL: 65 mg/dL (ref >39)
LDL Cholesterol: 138 mg/dL — ABNORMAL HIGH (ref 0–99)
Total CHOL/HDL Ratio: 3.9 Ratio
Triglycerides: 245 mg/dL — ABNORMAL HIGH (ref <150)
VLDL: 49 mg/dL — ABNORMAL HIGH (ref 0–40)

## 2014-02-13 MED ORDER — TRIAMCINOLONE ACETONIDE 0.1 % EX CREA: 1.0000 "application " | TOPICAL_CREAM | Freq: Two times a day (BID) | CUTANEOUS | Status: DC

## 2014-02-13 MED ORDER — TRIAMCINOLONE ACETONIDE 0.5 % EX CREA: TOPICAL_CREAM | Freq: Two times a day (BID) | CUTANEOUS | Status: DC

## 2014-02-13 MED ORDER — TAMSULOSIN HCL 0.4 MG PO CAPS: 0.4000 mg | ORAL_CAPSULE | Freq: Every day | ORAL | Status: DC

## 2014-02-13 MED ORDER — HYDROCODONE-ACETAMINOPHEN 5-325 MG PO TABS: 1.0000 | ORAL_TABLET | Freq: Four times a day (QID) | ORAL | Status: DC | PRN

## 2014-02-13 MED ORDER — SILDENAFIL CITRATE 100 MG PO TABS
50.0000 mg | ORAL_TABLET | Freq: Every day | ORAL | Status: DC | PRN
Start: 2014-02-13 — End: 2015-12-24

## 2014-02-13 MED ORDER — LOSARTAN POTASSIUM-HCTZ 100-12.5 MG PO TABS: 1.0000 | ORAL_TABLET | Freq: Every day | ORAL | Status: DC

## 2014-02-13 NOTE — Progress Notes (Signed)
53 yo married Hispanic man who works in [employer] on [address] [employer].  He is here for refill of medications.  His main complaint is leg cramps, particularly at night.  He uses icy-hot OTC which helps.  He has had hypertension for over 10 years. He denies shortness of breath or  Chest pain.  No edema, either.  His chronic itchy rash in groins, lower legs, abdomen and forearms is well controlled with his triamcinolone cream.  He also has some scalp itching.  He reports recurrent kidney stones which he recently passed with some intense left groin and flank pain.  These run in his family with father and brother.  He has intermittent hematuria associated with the pain.  Objective:  NAD HEENT:  Unremarkable Chest:  Clear Heart:  Regular without murmur or gallop Skin: diffuse neurodermatitis excoriations.  Absent index fingernails bilaterally Ext:  No edema Genitalia:  Normal groins where rash was worst. Results for orders placed in visit on 02/13/14  POCT URINALYSIS DIPSTICK      Result Value Ref Range   Color, UA amber     Clarity, UA cloudy     Glucose, UA neg     Bilirubin, UA neg     Ketones, UA neg     Spec Grav, UA 1.020     Blood, UA large     pH, UA 6.0     Protein, UA 30     Urobilinogen, UA 1.0     Nitrite, UA neg     Leukocytes, UA Negative        Essential hypertension - Plan: losartan-hydrochlorothiazide (HYZAAR) 100-12.5 MG per tablet, Comprehensive metabolic panel, Lipid panel, POCT urinalysis dipstick  Erectile dysfunction, unspecified erectile dysfunction type - Plan: sildenafil (VIAGRA) 100 MG tablet  Kidney stones - Plan: tamsulosin (FLOMAX) 0.4 MG CAPS capsule, HYDROcodone-acetaminophen (NORCO) 5-325 MG per tablet, Comprehensive metabolic panel, POCT urinalysis dipstick  Rash - Plan: triamcinolone cream (KENALOG) 0.1 %, triamcinolone cream (KENALOG) 0.5 %  Signed, Robyn Haber, MD

## 2014-02-13 NOTE — Patient Instructions (Signed)
Clculos renales (Kidney Stones) Los clculos renales (urolitiasis) son masas slidas que se forman en el interior de los riones. El dolor intenso es causado por el movimiento de la piedra a travs del tracto urinario. Cuando la piedra se mueve, el urter hace un espasmo alrededor de la misma. El clculo generalmente se elimina con la orina.  CAUSAS   Un trastorno que hace que ciertas glndulas del cuello produzcan demasiada hormona paratiroidea (hiperparatiroidismo primario).  Una acumulacin de cristales de cido rico, similar a la gota en las articulaciones.  Estrechamiento (constriccin) del urter.  Obstruccin en el rin presente al nacer (obstruccin congnita).  Cirugas previas del rin o los urteres.  Numerosas infecciones renales. SNTOMAS   Ganas de vomitar (nuseas).  Devolver la comida (vomitar).  Sangre en la orina (hematuria).  Dolor que generalmente se expande (irradia) hacia la ingle.  Ganas de orinar con frecuencia o de manera urgente. DIAGNSTICO   Historia clnica y examen fsico.  Anlisis de sangre y orina.  Tomografa computada.  En algunos casos se realiza un examen del interior de la vejiga (citoscopa). TRATAMIENTO   Observacin.  Aumentar la ingesta de lquidos.  Litotricia extracorprea con ondas de choque: es un procedimiento no invasivo que utiliza ondas de choque para romper los clculos renales.  Ser necesaria la ciruga si tiene dolor muy intenso o la obstruccin persiste. Hay varios procedimientos quirrgicos. La mayora de los procedimientos se realizan con el uso de pequeos instrumentos. Slo es necesario realizar pequeas incisiones para acomodar estos instrumentos, por lo tanto el tiempo de recuperacin es mnimo. El tamao, la ubicacin y la composicin qumica de los clculos son variables importantes que determinarn la eleccin correcta de tratamiento para su caso. Comunquese con su mdico para comprender mejor su  situacin, de modo que pueda minimizar los riesgos de lesiones para usted y su rin.  INSTRUCCIONES PARA EL CUIDADO EN EL HOGAR   Beba gran cantidad de lquido para mantener la orina de tono claro o color amarillo plido. Esto ayudar a eliminar las piedras o los fragmentos.  Cuele la orina con el colador que le han provisto. Guarde todas las partculas y piedras para que las vea el profesional que lo asiste. Puede ser tan pequea como un grano de sal. Es muy importante usar el colador cada vez que orine. La recoleccin de piedras permitir al mdico analizar y verificar que efectivamente ha eliminado una piedra. El anlisis de la piedra con frecuencia permitir identificar qu puede hacer para reducir la incidencia de las recurrencias.  Slo tome medicamentos de venta libre o recetados para calmar el dolor, el malestar o bajar la fiebre, segn las indicaciones de su mdico.  Cumpla con las citas de seguimiento tal como le indic el profesional que lo asiste.  Si se lo indica, hgase radiografas. La ausencia de dolor no siempre significa que las piedras se han eliminado. Puede ser que simplemente hayan dejado de moverse. Si el paso de orina permanece completamente obstruido, puede causar prdida de la funcin renal o simplemente la destruccin del rin. Es su responsabilidad completar el seguimiento y las radiografas. Las ecografas del rin pueden mostrar una obstruccin y el estado del rin. Las ecografas no se asocian con la radiacin y pueden realizarse fcilmente en cuestin de minutos. SOLICITE ATENCIN MDICA SI:  Siente dolor que no responde a los analgsicos que le recetaron. SOLICITE ATENCIN MDICA DE INMEDIATO SI:   No puede controlar el dolor con los medicamentos que le han recetado.  Siente escalofros   o fiebre.  La gravedad o la intensidad del dolor aumenta durante 18 horas y no se Engineer, production con los analgsicos.  Presenta un nuevo episodio de dolor abdominal.  Sufre mareos  o se desmaya.  No puede orinar. ASEGRESE DE QUE:   Comprende estas instrucciones.  Controlar su afeccin.  Recibir ayuda de inmediato si no mejora o si empeora. Document Released: 05/23/2005 Document Revised: 01/23/2013 [facility] Patient Information 2015 [place]. This information is not intended to replace advice given to you by your health care provider. Make sure you discuss any questions you have with your health care provider. Hypertension Hypertension, commonly called high blood pressure, is when the force of blood pumping through your arteries is too strong. Your arteries are the blood vessels that carry blood from your heart throughout your body. A blood pressure reading consists of a higher number over a lower number, such as 110/72. The higher number (systolic) is the pressure inside your arteries when your heart pumps. The lower number (diastolic) is the pressure inside your arteries when your heart relaxes. Ideally you want your blood pressure below 120/80. Hypertension forces your heart to work harder to pump blood. Your arteries may become narrow or stiff. Having hypertension puts you at risk for heart disease, stroke, and other problems.  RISK FACTORS Some risk factors for high blood pressure are controllable. Others are not.  Risk factors you cannot control include:   Race. You may be at higher risk if you are African American.  Age. Risk increases with age.  Gender. Men are at higher risk than women before age 45 years. After age 23, women are at higher risk than men. Risk factors you can control include:  Not getting enough exercise or physical activity.  Being overweight.  Getting too much fat, sugar, calories, or salt in your diet.  Drinking too much alcohol. SIGNS AND SYMPTOMS Hypertension does not usually cause signs or symptoms. Extremely high blood pressure (hypertensive crisis) may cause headache, anxiety, shortness of breath, and  nosebleed. DIAGNOSIS  To check if you have hypertension, your health care provider will measure your blood pressure while you are seated, with your arm held at the level of your heart. It should be measured at least twice using the same arm. Certain conditions can cause a difference in blood pressure between your right and left arms. A blood pressure reading that is higher than normal on one occasion does not mean that you need treatment. If one blood pressure reading is high, ask your health care provider about having it checked again. TREATMENT  Treating high blood pressure includes making lifestyle changes and possibly taking medicine. Living a healthy lifestyle can help lower high blood pressure. You may need to change some of your habits. Lifestyle changes may include:  Following the DASH diet. This diet is high in fruits, vegetables, and whole grains. It is low in salt, red meat, and added sugars.  Getting at least 2 hours of brisk physical activity every week.  Losing weight if necessary.  Not smoking.  Limiting alcoholic beverages.  Learning ways to reduce stress. If lifestyle changes are not enough to get your blood pressure under control, your health care provider may prescribe medicine. You may need to take more than one. Work closely with your health care provider to understand the risks and benefits. HOME CARE INSTRUCTIONS  Have your blood pressure rechecked as directed by your health care provider.   Take medicines only as directed by your health care  provider. Follow the directions carefully. Blood pressure medicines must be taken as prescribed. The medicine does not work as well when you skip doses. Skipping doses also puts you at risk for problems.   Do not smoke.   Monitor your blood pressure at home as directed by your health care provider. SEEK MEDICAL CARE IF:   You think you are having a reaction to medicines taken.  You have recurrent headaches or feel  dizzy.  You have swelling in your ankles.  You have trouble with your vision. SEEK IMMEDIATE MEDICAL CARE IF:  You develop a severe headache or confusion.  You have unusual weakness, numbness, or feel faint.  You have severe chest or abdominal pain.  You vomit repeatedly.  You have trouble breathing. MAKE SURE YOU:   Understand these instructions.  Will watch your condition.  Will get help right away if you are not doing well or get worse. Document Released: 05/23/2005 Document Revised: 10/07/2013 Document Reviewed: 03/15/2013 [facility] Patient Information 2015 [place], [place]. This information is not intended to replace advice given to you by your health care provider. Make sure you discuss any questions you have with your health care provider.

## 2014-02-14 LAB — URINE CULTURE
Colony Count: NO GROWTH
Organism ID, Bacteria: NO GROWTH

## 2014-02-17 ENCOUNTER — Other Ambulatory Visit: Payer: Self-pay | Admitting: Family Medicine

## 2014-02-17 DIAGNOSIS — R319 Hematuria, unspecified: Secondary | ICD-10-CM

## 2014-04-08 ENCOUNTER — Other Ambulatory Visit: Payer: Self-pay | Admitting: Dermatology

## 2014-04-17 ENCOUNTER — Ambulatory Visit (INDEPENDENT_AMBULATORY_CARE_PROVIDER_SITE_OTHER): Payer: No Typology Code available for payment source | Admitting: Physician Assistant

## 2014-04-17 DIAGNOSIS — Z111 Encounter for screening for respiratory tuberculosis: Secondary | ICD-10-CM

## 2014-04-17 NOTE — Progress Notes (Signed)
Pt presents to clinic for a TB screening for work.  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

## 2014-04-17 NOTE — Progress Notes (Signed)
  Tuberculosis Risk Questionnaire  1. No Were you born outside the [place] in one of the following parts of the world: [place], [place], [place], [place] or [place]?    2. No Have you traveled outside the [place] and lived for more than one month in one of the following parts of the world: [place], [place], [place], [place] or [place]?    3. No Do you have a compromised immune system such as from any of the following conditions:HIV/AIDS, organ or bone marrow transplantation, diabetes, immunosuppressive medicines (e.g. Prednisone, Remicaide), leukemia, lymphoma, cancer of the head or neck, gastrectomy or jejunal bypass, end-stage renal disease (on dialysis), or silicosis?     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    5. No Have you ever: injected illegal drugs, used crack cocaine, lived in a homeless shelter  or been in jail or prison?     6. No Have you ever been exposed to anyone with infectious tuberculosis?    Tuberculosis Symptom Questionnaire  Do you currently have any of the following symptoms?  1. No Unexplained cough lasting more than 3 weeks?   2. No Unexplained fever lasting more than 3 weeks.   3. No Night Sweats (sweating that leaves the bedclothes and sheets wet)     4. No Shortness of Breath   5. No Chest Pain   6. No Unintentional weight loss    7. No Unexplained fatigue (very tired for no reason)

## 2014-04-20 ENCOUNTER — Ambulatory Visit (INDEPENDENT_AMBULATORY_CARE_PROVIDER_SITE_OTHER): Payer: No Typology Code available for payment source | Admitting: *Deleted

## 2014-04-20 DIAGNOSIS — Z111 Encounter for screening for respiratory tuberculosis: Secondary | ICD-10-CM

## 2014-04-20 LAB — TB SKIN TEST
INDURATION: 0 mm
TB SKIN TEST: NEGATIVE

## 2015-01-17 ENCOUNTER — Ambulatory Visit (INDEPENDENT_AMBULATORY_CARE_PROVIDER_SITE_OTHER): Payer: Worker's Compensation | Admitting: Emergency Medicine

## 2015-01-17 VITALS — BP 154/90 | HR 84 | Temp 98.2°F | Resp 16 | Ht 67.0 in | Wt 187.2 lb

## 2015-01-17 DIAGNOSIS — S46912A Strain of unspecified muscle, fascia and tendon at shoulder and upper arm level, left arm, initial encounter: Secondary | ICD-10-CM

## 2015-01-17 MED ORDER — NAPROXEN SODIUM 550 MG PO TABS: 550.0000 mg | ORAL_TABLET | Freq: Two times a day (BID) | ORAL | Status: DC

## 2015-01-17 MED ORDER — CYCLOBENZAPRINE HCL 10 MG PO TABS: 10.0000 mg | ORAL_TABLET | Freq: Three times a day (TID) | ORAL | Status: DC | PRN

## 2015-01-17 MED ORDER — HYDROCODONE-ACETAMINOPHEN 5-325 MG PO TABS: 1.0000 | ORAL_TABLET | ORAL | Status: DC | PRN

## 2015-01-17 NOTE — Patient Instructions (Signed)
Distensin muscular. (Muscle Strain) Una distensin muscular es una lesin que se produce cuando un msculo se estira ms all de su largo normal. Cuando esto sucede, por lo general se desgarra un pequeo nmero de fibras musculares. La distensin muscular se califica en grados. Las distensiones de Neurosurgeon son aquellas en las cuales el desgarro y el dolor afectan a la menor cantidad de fibras musculares. Las distensiones de segundo y tercer grado involucran una proporcin cada vez mayor de desgarro y Social research officer, government.  En general, la recuperacin de una distensin muscular tarda de 1 a 2semanas. La curacin completa tarda de 5 a 6semanas.  CAUSAS  Las distensiones musculares ocurren cuando se aplica una fuerza violenta y repentina sobre un msculo y este se estira demasiado. Esto puede ocurrir cuando se Magazine features editor, se practican deportes o en una cada.  [place] distensin muscular es especialmente comn en los atletas.  SIGNOS Y SNTOMAS En el lugar de la distensin muscular se puede presentar lo siguiente:  Dolor.  Moretones.  Hinchazn.  Dificultad para usar el msculo debido al dolor o a un funcionamiento anormal. DIAGNSTICO  El mdico le har un examen fsico y le har preguntas sobre sus antecedentes mdicos. TRATAMIENTO  Con frecuencia, el mejor tratamiento para una distensin muscular es el reposo, y la aplicacin de hielo y de compresas fras en la zona de la lesin.  INSTRUCCIONES PARA EL CUIDADO EN EL HOGAR   Use el mtodo PRICE (por sus siglas en ingls) de tratamiento para estimular la curacin durante los primeros 2 a 3das posteriores a la lesin. El mtodo PRICE implica lo siguiente:  Proteger al msculo de nuevas lesiones.  Limitar la actividad y Production assistant, radio la parte del cuerpo lesionada.  Aplicar hielo a la lesin. Para hacerlo, ponga hielo en una bolsa plstica. Coloque una toalla entre la piel y la bolsa de hielo. Luego aplique el hielo y djelo actuar  de 14 a 96minutos por hora. Despus del Building control surveyor, cambie a compresas de calor hmedo.  Comprimir la zona lesionada con una frula o venda elstica. Tenga cuidado de no ajustarla demasiado. Esto puede interferir con la circulacin sangunea o aumentar la hinchazn.  Mantener la zona lesionada por encima del nivel del corazn con la mayor frecuencia posible.  Utilice los medicamentos de venta libre o recetados para Glass blower/designer, el malestar o la fiebre, segn se lo indique el mdico.  Optometrist un calentamiento antes de hacer ejercicio ayuda a prevenir distensiones musculares futuras. SOLICITE ATENCIN MDICA SI:   Siente un dolor cada vez ms intenso o hinchazn en la zona lesionada.  Siente adormecimiento, hormigueo o nota una prdida importante de fuerza en la zona lesionada. ASEGRESE DE QUE:   Comprende estas instrucciones.  Controlar su afeccin.  Recibir ayuda de inmediato si no mejora o si empeora. Document Released: 03/02/2005 Document Revised: 03/13/2013 [facility] Patient Information 2015 [place]. This information is not intended to replace advice given to you by your health care provider. Make sure you discuss any questions you have with your health care provider.

## 2015-01-17 NOTE — Progress Notes (Signed)
Subjective:  Patient ID: Jackson Sullivan, male    DOB: [birth year]  Age: 54 y.o. MRN: [id number]  CC: workers comp   HPI [employer] presents  with an injury to his left shoulder. This morning he listed a pocket was on the stove was very heavy and he felt immediate pain in his left shoulder from the base the neck over the shoulder itself. He has some pain radiating into his arm with no numbness tingling or weakness he denies any direct injury. Has no history of prior neck or back injury. He's had no improvement with over-the-counter medication  History Review of Systems   Review of systems noncontributory  Objective:  BP 154/90 mmHg  Pulse 84  Temp(Src) 98.2 F (36.8 C) (Oral)  Resp 16  Ht 5\' 7"  (1.702 m)  Wt 187 lb 3.2 oz (84.913 kg)  BMI 29.31 kg/m2  SpO2 98%  Physical Exam  Constitutional: He is oriented to person, place, and time. He appears well-developed and well-nourished. No distress.  HENT:  Head: Normocephalic and atraumatic.  Right Ear: External ear normal.  Left Ear: External ear normal.  Nose: Nose normal.  Eyes: Conjunctivae and EOM are normal. Pupils are equal, round, and reactive to light. No scleral icterus.  Neck: Normal range of motion. Neck supple. No tracheal deviation present.  Cardiovascular: Normal rate, regular rhythm and normal heart sounds.   Pulmonary/Chest: Effort normal. No respiratory distress. He has no wheezes. He has no rales.  Abdominal: He exhibits no mass. There is no tenderness. There is no rebound and no guarding.  Musculoskeletal: He exhibits no edema.       Left shoulder: He exhibits decreased range of motion and tenderness.  Lymphadenopathy:    He has no cervical adenopathy.  Neurological: He is alert and oriented to person, place, and time. Coordination normal.  Skin: Skin is warm and dry. No rash noted.  Psychiatric: He has a normal mood and affect. His behavior is normal.    Review of his past medical family and social  history were normal  Assessment & Plan:   [place] was seen today for workers comp.  Diagnoses and all orders for this visit:  Shoulder strain, left, initial encounter  Other orders -     naproxen sodium (ANAPROX DS) 550 MG tablet; Take 1 tablet (550 mg total) by mouth 2 (two) times daily with a meal. -     cyclobenzaprine (FLEXERIL) 10 MG tablet; Take 1 tablet (10 mg total) by mouth 3 (three) times daily as needed for muscle spasms. -     HYDROcodone-acetaminophen (NORCO) 5-325 MG per tablet; Take 1-2 tablets by mouth every 4 (four) hours as needed.   I am having Mr. Hagmann start on naproxen sodium, cyclobenzaprine, and HYDROcodone-acetaminophen. I am also having him maintain his aspirin, losartan-hydrochlorothiazide, sildenafil, tamsulosin, triamcinolone cream, triamcinolone cream, and HYDROcodone-acetaminophen.  Meds ordered this encounter  Medications  . naproxen sodium (ANAPROX DS) 550 MG tablet    Sig: Take 1 tablet (550 mg total) by mouth 2 (two) times daily with a meal.    Dispense:  40 tablet    Refill:  0  . cyclobenzaprine (FLEXERIL) 10 MG tablet    Sig: Take 1 tablet (10 mg total) by mouth 3 (three) times daily as needed for muscle spasms.    Dispense:  30 tablet    Refill:  0  . HYDROcodone-acetaminophen (NORCO) 5-325 MG per tablet    Sig: Take 1-2 tablets by mouth every 4 (four)  hours as needed.    Dispense:  30 tablet    Refill:  0    Appropriate red flag conditions were discussed with the patient as well as actions that should be taken.  Patient expressed his understanding.  Follow-up: Return in 1 week (on 01/24/2015).  Roselee Culver, MD

## 2015-01-25 ENCOUNTER — Ambulatory Visit (INDEPENDENT_AMBULATORY_CARE_PROVIDER_SITE_OTHER): Payer: Worker's Compensation | Admitting: Emergency Medicine

## 2015-01-25 VITALS — BP 128/82 | HR 73 | Temp 98.3°F | Resp 14 | Ht 67.5 in | Wt 189.8 lb

## 2015-01-25 DIAGNOSIS — S46912D Strain of unspecified muscle, fascia and tendon at shoulder and upper arm level, left arm, subsequent encounter: Secondary | ICD-10-CM

## 2015-01-25 NOTE — Progress Notes (Signed)
   Subjective:  Patient ID: Daymon Hora, male    DOB: 1960/07/24  Age: 54 y.o. 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 He said the intervals pains got less and he has increased mobility. Still has pain with heavy lifting. He has no radicular symptoms or weakness. No neck pain   History   his past medical family and social history noncontributory  Review of Systems  Objective:  BP 128/82 mmHg  Pulse 73  Temp(Src) 98.3 F (36.8 C) (Oral)  Resp 14  Ht 5' 7.5" (1.715 m)  Wt 189 lb 12.8 oz (86.093 kg)  BMI 29.27 kg/m2  SpO2 98%  Physical Exam  Constitutional: He is oriented to person, place, and time. He appears well-developed and well-nourished.  HENT:  Head: Normocephalic and atraumatic.  Eyes: Conjunctivae are normal. Pupils are equal, round, and reactive to light.  Pulmonary/Chest: Effort normal.  Musculoskeletal: He exhibits no edema.  Neurological: He is alert and oriented to person, place, and time.  Skin: Skin is dry.  Psychiatric: He has a normal mood and affect. His behavior is normal. Thought content normal.   he has limitation of movement of his left shoulder. He can't reach around his back with his left hand. Is some discomfort with elevation of his arm     Assessment & Plan:   Maanav was seen today for follow-up.  Diagnoses and all orders for this visit:  Shoulder strain, left, subsequent encounter   I am having Mr. Krysiak maintain his aspirin, losartan-hydrochlorothiazide, sildenafil, tamsulosin, triamcinolone cream, triamcinolone cream, HYDROcodone-acetaminophen, naproxen sodium, cyclobenzaprine, and HYDROcodone-acetaminophen.  No orders of the defined types were placed in this encounter.   he was continued on light duty for another week will follow-up   Appropriate red flag conditions  were discussed with the patient as well as actions that should be taken.  Patient expressed his understanding.  Follow-up: No Follow-up on file.  Roselee Culver, MD

## 2015-01-25 NOTE — Patient Instructions (Signed)

## 2015-09-07 ENCOUNTER — Encounter (HOSPITAL_COMMUNITY): Payer: Self-pay | Admitting: Emergency Medicine

## 2015-09-07 ENCOUNTER — Emergency Department (HOSPITAL_COMMUNITY)
Admission: EM | Admit: 2015-09-07 | Discharge: 2015-09-07 | Disposition: A | Discharge status: Home / Self Care | Payer: 59 | Attending: Emergency Medicine | Admitting: Emergency Medicine

## 2015-09-07 ENCOUNTER — Emergency Department (HOSPITAL_COMMUNITY): Payer: 59

## 2015-09-07 DIAGNOSIS — Z7982 Long term (current) use of aspirin: Secondary | ICD-10-CM | POA: Diagnosis not present

## 2015-09-07 DIAGNOSIS — R102 Pelvic and perineal pain: Secondary | ICD-10-CM | POA: Insufficient documentation

## 2015-09-07 DIAGNOSIS — F17210 Nicotine dependence, cigarettes, uncomplicated: Secondary | ICD-10-CM | POA: Diagnosis not present

## 2015-09-07 DIAGNOSIS — R300 Dysuria: Secondary | ICD-10-CM | POA: Diagnosis not present

## 2015-09-07 DIAGNOSIS — I10 Essential (primary) hypertension: Secondary | ICD-10-CM | POA: Insufficient documentation

## 2015-09-07 DIAGNOSIS — Z8619 Personal history of other infectious and parasitic diseases: Secondary | ICD-10-CM | POA: Diagnosis not present

## 2015-09-07 DIAGNOSIS — Z87448 Personal history of other diseases of urinary system: Secondary | ICD-10-CM | POA: Insufficient documentation

## 2015-09-07 DIAGNOSIS — Z87442 Personal history of urinary calculi: Secondary | ICD-10-CM | POA: Diagnosis not present

## 2015-09-07 LAB — URINALYSIS, ROUTINE W REFLEX MICROSCOPIC
Bilirubin Urine: NEGATIVE
Glucose, UA: NEGATIVE mg/dL
Ketones, ur: NEGATIVE mg/dL
Nitrite: NEGATIVE
Protein, ur: 30 mg/dL — AB
Specific Gravity, Urine: 1.023 (ref 1.005–1.030)
pH: 6 (ref 5.0–8.0)

## 2015-09-07 LAB — URINE MICROSCOPIC-ADD ON

## 2015-09-07 MED ORDER — TAMSULOSIN HCL 0.4 MG PO CAPS: 0.4000 mg | ORAL_CAPSULE | Freq: Every day | ORAL | Status: DC

## 2015-09-07 MED ORDER — ONDANSETRON 4 MG PO TBDP: 4.0000 mg | ORAL_TABLET | Freq: Once | ORAL | Status: DC

## 2015-09-07 MED ORDER — ONDANSETRON HCL 4 MG/2ML IJ SOLN: 4.0000 mg | Freq: Once | INTRAMUSCULAR | Status: DC

## 2015-09-07 MED ORDER — GI COCKTAIL ~~LOC~~: 30.0000 mL | Freq: Once | ORAL | Status: DC

## 2015-09-07 MED ORDER — CIPROFLOXACIN HCL 500 MG PO TABS: 500.0000 mg | ORAL_TABLET | Freq: Two times a day (BID) | ORAL | Status: DC

## 2015-09-07 MED ORDER — MORPHINE SULFATE (PF) 4 MG/ML IV SOLN: 4.0000 mg | Freq: Once | INTRAVENOUS | Status: DC

## 2015-09-07 MED ORDER — KETOROLAC TROMETHAMINE 60 MG/2ML IM SOLN: 60.0000 mg | Freq: Once | INTRAMUSCULAR | Status: DC

## 2015-09-07 MED ORDER — MORPHINE SULFATE (PF) 4 MG/ML IV SOLN
4.0000 mg | Freq: Once | INTRAVENOUS | Status: DC
Start: 2015-09-07 — End: 2015-09-07

## 2015-09-07 NOTE — ED Notes (Addendum)
Pt c/o kidney stone on right side that hasn't passed since January. States it's painful to sit or any time he gets close to the floor. Pt states he has been urinating blood recently. Hx of kidney stones, does not appear to be in any obvious distress at this time.

## 2015-09-07 NOTE — ED Notes (Signed)
Bed: WA16 Expected date:  Expected time:  Means of arrival:  Comments: Hold for triage 

## 2015-09-07 NOTE — Discharge Instructions (Signed)
Follow-up with your urologist. Try the medications. Pelvic Pain, Male Pelvic pain is pain below the belly button and located between your hips in the groin. Acute pain may last a few hours or days. Chronic pelvic pain may last weeks and months. The cause may be different for different types of pain. The pain may be dull or sharp, mild or severe and can interfere with your daily activities. CAUSES  Some common causes of male pelvic pain include:  A sexually transmitted disease (STD).  Inflammation of the prostate (prostatitis).  A bladder infection.  Intestinal problems, such as constipation, irritable bowel syndrome, colitis, or ileitis.  Kidney stones.  Stress or depression.  Muscle spasms or pain in the pelvic floor muscles.  Musculoskeletal problems of the back.  A hernia.  Inflammation of the epididymis, which sits on top of the testicle (epididymitis).  Cancer. EVALUATION  Your caregiver will want to take a careful history of your concerns. This includes recent changes in your health and a sexual history. Obtaining your family history and medical history is also important. In order to identify the cause of the pelvic pain and to be properly treated, your caregiver will perform a physical exam and may order tests. These tests may include:   Ultrasound of the prostate or scrotum.  CT scan or MRI of the pelvis or lower spine.  A urinalysis or evaluation of discharge from the penis.  Blood tests. HOME CARE INSTRUCTIONS   Only take over-the-counter or prescription medicines as directed by your caregiver.  Rest as directed by your caregiver.  Eat a balanced diet.  Drink enough fluids to make your urine clear or pale yellow, or as directed.  Avoid sexual intercourse if it causes pain.  Apply warm or cold compresses to the lower abdomen depending on which one helps the pain.  Avoid stressful situations.  Keep a journal of your pelvic pain. Write down when it started,  where the pain is located, and if there are things that seem to be associated with the pain, such as food or certain activities.  Follow up with your caregiver as directed. SEEK MEDICAL CARE IF: Your medicine does not help your pain. SEEK IMMEDIATE MEDICAL CARE IF:  Your pelvic pain increases.  You feel lightheaded or faint.  You have chills.  You have a fever or persistent symptoms for more than 3 days.  You have a fever and your symptoms suddenly get worse.  You have pain with urination or blood in your urine.  You have uncontrolled diarrhea or vomiting. MAKE SURE YOU:   Understand these instructions.  Will watch your condition.  Will get help if you are not doing well or get worse.   This information is not intended to replace advice given to you by your health care provider. Make sure you discuss any questions you have with your health care provider.   Document Released: 02/15/2012 Document Revised: 06/13/2014 Document Reviewed: 02/15/2012 Elsevier Interactive Patient Education [employer].

## 2015-09-07 NOTE — ED Provider Notes (Addendum)
CSN: [id number]     Arrival date & time 09/07/15  1457 History   First MD Initiated Contact with Patient 09/07/15 1719     Chief Complaint  Patient presents with  . Nephrolithiasis     (Consider location/radiation/quality/duration/timing/severity/associated sxs/prior Treatment) Patient is a 55 y.o. male presenting with male genitourinary complaint and general illness. The history is provided by the patient.  Male GU Problem Presenting symptoms: dysuria   Presenting symptoms: no penile discharge   Relieved by:  Position Worsened by:  Tactile pressure (sitting) Ineffective treatments:  None tried Associated symptoms: no abdominal pain, no diarrhea, no fever, no flank pain, no penile swelling, no urinary frequency and no vomiting   Risk factors: kidney stones   Illness Severity:  Moderate Onset quality:  Gradual Duration:  3 months Timing:  Constant Progression:  Unchanged Chronicity:  New Associated symptoms: no abdominal pain, no chest pain, no congestion, no diarrhea, no fever, no headaches, no myalgias, no rash, no shortness of breath and no vomiting    55 yo  M With a chief complaint of pelvic pain. He states is been going on for a few weeks. He had a kidney stone that he felt go down his flank in December but since then he feels like it's been stuck in his bladder. The pain is actually at the base of the scrotum. Worse with sitting or palpation. He denies difficulty with bowel movements. Has had some pain with urination. Denies fevers or chills. He would like a medicine to help make the tubes bigger so the stone will come out.  Past Medical History  Diagnosis Date  . Hypertension   . Fungal infection   . Renal disorder    Past Surgical History  Procedure Laterality Date  . Testicle surgery    . Colonoscopy N/A 05/17/2013    Procedure: COLONOSCOPY;  Surgeon: Beryle Beams, MD;  Location: [facility];  Service: Endoscopy;  Laterality: N/A;   History reviewed. No pertinent  family history. Social History  Substance Use Topics  . Smoking status: Current Every Day Smoker    Types: Cigarettes  . Smokeless tobacco: None  . Alcohol Use: No    Review of Systems  Constitutional: Negative for fever and chills.  HENT: Negative for congestion and facial swelling.   Eyes: Negative for discharge and visual disturbance.  Respiratory: Negative for shortness of breath.   Cardiovascular: Negative for chest pain and palpitations.  Gastrointestinal: Negative for vomiting, abdominal pain and diarrhea.  Genitourinary: Positive for dysuria and difficulty urinating. Negative for frequency, flank pain, discharge and penile swelling.  Musculoskeletal: Negative for myalgias and arthralgias.  Skin: Negative for color change and rash.  Neurological: Negative for tremors, syncope and headaches.  Psychiatric/Behavioral: Negative for confusion and dysphoric mood.      Allergies  Review of patient's allergies indicates no known allergies.  Home Medications   Prior to Admission medications   Medication Sig Start Date End Date Taking? Authorizing Provider  aspirin 81 MG tablet Take 81 mg by mouth daily.    Historical Provider, MD  cyclobenzaprine (FLEXERIL) 10 MG tablet Take 1 tablet (10 mg total) by mouth 3 (three) times daily as needed for muscle spasms. 01/17/15   Roselee Culver, MD  HYDROcodone-acetaminophen (NORCO) 5-325 MG per tablet Take 1 tablet by mouth every 6 (six) hours as needed for moderate pain. Patient not taking: Reported on 01/25/2015 02/13/14   Robyn Haber, MD  HYDROcodone-acetaminophen [facility]) 5-325 MG per tablet Take 1-2 tablets  by mouth every 4 (four) hours as needed. 01/17/15   Roselee Culver, MD  losartan-hydrochlorothiazide (HYZAAR) 100-12.5 MG per tablet Take 1 tablet by mouth daily. Patient not taking: Reported on 01/17/2015 02/13/14   Robyn Haber, MD  naproxen sodium (ANAPROX DS) 550 MG tablet Take 1 tablet (550 mg total) by mouth 2 (two)  times daily with a meal. Patient not taking: Reported on 01/25/2015 01/17/15 01/17/16  Roselee Culver, MD  sildenafil (VIAGRA) 100 MG tablet Take 0.5-1 tablets (50-100 mg total) by mouth daily as needed for erectile dysfunction. Patient not taking: Reported on 01/17/2015 02/13/14   Robyn Haber, MD  tamsulosin (FLOMAX) 0.4 MG CAPS capsule Take 1 capsule (0.4 mg total) by mouth daily. Patient not taking: Reported on 01/17/2015 02/13/14   Robyn Haber, MD  triamcinolone cream (KENALOG) 0.1 % Apply 1 application topically 2 (two) times daily. Patient not taking: Reported on 01/17/2015 02/13/14   Robyn Haber, MD  triamcinolone cream (KENALOG) 0.5 % Apply topically 2 (two) times daily. Patient not taking: Reported on 01/17/2015 02/13/14   Robyn Haber, MD   BP 153/93 mmHg  Pulse 83  Temp(Src) 97.7 F (36.5 C) (Oral)  Resp 16  SpO2 100% Physical Exam  Constitutional: He is oriented to person, place, and time. He appears well-developed and well-nourished.  HENT:  Head: Normocephalic and atraumatic.  Eyes: EOM are normal. Pupils are equal, round, and reactive to light.  Neck: Normal range of motion. Neck supple. No JVD present.  Cardiovascular: Normal rate and regular rhythm.  Exam reveals no gallop and no friction rub.   No murmur heard. Pulmonary/Chest: No respiratory distress. He has no wheezes.  Abdominal: He exhibits no distension. There is no rebound and no guarding. Hernia confirmed negative in the right inguinal area and confirmed negative in the left inguinal area.  Genitourinary: Testes normal. Prostate is enlarged and tender. Cremasteric reflex is present. Uncircumcised. No penile erythema or penile tenderness. No discharge found.     Musculoskeletal: Normal range of motion.  Lymphadenopathy:       Right: No inguinal adenopathy present.       Left: No inguinal adenopathy present.  Neurological: He is alert and oriented to person, place, and time.  Skin: No rash noted. No  pallor.  Psychiatric: He has a normal mood and affect. His behavior is normal.  Nursing note and vitals reviewed.   ED Course  Procedures (including critical care time) Labs Review Labs Reviewed  URINALYSIS, ROUTINE W REFLEX MICROSCOPIC (NOT AT [facility]) - Abnormal; Notable for the following:    Color, Urine BROWN (*)    APPearance CLOUDY (*)    Hgb urine dipstick LARGE (*)    Protein, ur 30 (*)    Leukocytes, UA SMALL (*)    All other components within normal limits  URINE MICROSCOPIC-ADD ON - Abnormal; Notable for the following:    Squamous Epithelial / LPF 0-5 (*)    Bacteria, UA FEW (*)    All other components within normal limits    Imaging Review US Renal  09/07/2015  CLINICAL DATA:  Right flank pain for 3 months, possible hydro EXAM: RENAL / URINARY TRACT ULTRASOUND COMPLETE COMPARISON:  None. FINDINGS: Right Kidney: Length: 11.5 cm. Echogenicity within normal limits. No mass or hydronephrosis visualized. Left Kidney: Length: 11.6 cm. No hydronephrosis. Echogenic focus probable nonobstructive calculus in upper pole measures 9 mm. Probable nonobstructive calculus lower pole measures 1 cm. Bladder: The urinary bladder is empty. IMPRESSION: 1. No hydronephrosis. Nonobstructive  left renal calculi are noted the largest in lower pole measures 1 cm. Empty urinary bladder. Electronically Signed   By: Lahoma Crocker M.D.   On: 09/07/2015 17:17   I have personally reviewed and evaluated these images and lab results as part of my medical decision-making.   EKG Interpretation None      MDM   Final diagnoses:  Pelvic pain in male    55 yo M with a chief complaints of pelvic pain. Patient has no testicular pain no hernias no penile tenderness or discharge. Ultrasound of the kidneys with no hydronephrosis. Patient is well-appearing and nontoxic. We'll discharge the patient home with Flomax prescription. Had some tenderness on the prostate will treat presumptively with ciprofloxacin. Urology  follow-up.  5:57 PM:  I have discussed the diagnosis/risks/treatment options with the patient and believe the pt to be eligible for discharge home to follow-up with Urology. We also discussed returning to the ED immediately if new or worsening sx occur. We discussed the sx which are most concerning (e.g., sudden worsening pain, fever, inability to tolerate by mouth) that necessitate immediate return. Medications administered to the patient during their visit and any new prescriptions provided to the patient are listed below.  Medications given during this visit Medications - No data to display  New Prescriptions   No medications on file    The patient appears reasonably screen and/or stabilized for discharge and I doubt any other medical condition or other [facility] requiring further screening, evaluation, or treatment in the ED at this time prior to discharge.       Deno Etienne, DO 09/07/15 [place], DO 09/07/15 1758

## 2015-09-07 NOTE — ED Notes (Signed)
Discharge instructions, follow up care, and rx x2 reviewed with patient. Patient verbalized understanding. 

## 2015-11-16 ENCOUNTER — Ambulatory Visit (INDEPENDENT_AMBULATORY_CARE_PROVIDER_SITE_OTHER): Payer: 59 | Admitting: Internal Medicine

## 2015-11-16 VITALS — BP 153/99 | HR 89 | Temp 98.0°F | Resp 16 | Ht 66.0 in | Wt 187.0 lb

## 2015-11-16 DIAGNOSIS — R319 Hematuria, unspecified: Secondary | ICD-10-CM

## 2015-11-16 DIAGNOSIS — R300 Dysuria: Secondary | ICD-10-CM | POA: Diagnosis not present

## 2015-11-16 DIAGNOSIS — N200 Calculus of kidney: Secondary | ICD-10-CM

## 2015-11-16 LAB — POCT URINALYSIS DIP (MANUAL ENTRY)
BILIRUBIN UA: NEGATIVE
Glucose, UA: NEGATIVE
LEUKOCYTES UA: NEGATIVE
NITRITE UA: NEGATIVE
PH UA: 6
Protein Ur, POC: 30 — AB
Spec Grav, UA: 1.025
Urobilinogen, UA: 0.2

## 2015-11-16 LAB — POC MICROSCOPIC URINALYSIS (UMFC)

## 2015-11-16 MED ORDER — HYDROCODONE-ACETAMINOPHEN 5-325 MG PO TABS: 1.0000 | ORAL_TABLET | ORAL | Status: DC | PRN

## 2015-11-16 MED ORDER — PHENAZOPYRIDINE HCL 100 MG PO TABS: 100.0000 mg | ORAL_TABLET | Freq: Three times a day (TID) | ORAL | Status: DC | PRN

## 2015-11-16 NOTE — Progress Notes (Signed)
Subjective:  By signing my name below, I, Jackson Sullivan, attest that this documentation has been prepared under the direction and in the presence of [name], MD. Electronically Signed: Moises Sullivan, [place]. 11/16/2015 , 4:34 PM .  Patient was seen in Room 1 .   Patient ID: Jackson Sullivan, male    DOB: [birth year], 55 y.o.   MRN: [id number] Chief Complaint  Patient presents with  . Nephrolithiasis    pt says he has been having problem for awhile    HPI Jackson Sullivan is a 55 y.o. male who presents to [facility] complaining of pain in his urethra. Patient has a history of kidney stones of 28 years. He's used flomax in the past. He's had this issue reoccur back in January 7th. He's seen a kidney specialist and had ultrasound done of his kidneys. He was informed having a lot of kidney stones. He's been able to pass 2 smaller stones last week and 1 this morning. However, he feels that one is stuck in his penile shaft. He denies back pain, vomiting or nausea.   There is an office note from [facility] scanned into media from 2015, showing multiple stones with hematuria. Most recent renal ultrasound from April 2017 showed left renal calculi with no sign of obstruction.   flomax is causing too much flow with urgency and occas he can't get there quick enough---also ?effect on ejaculation    Current outpatient prescriptions:  .  aspirin 81 MG tablet, Take 81 mg by mouth daily., Disp: , Rfl:  .  ciprofloxacin (CIPRO) 500 MG tablet, Take 1 tablet (500 mg total) by mouth 2 (two) times daily. One po bid x 7 days (Patient not taking: Reported on 11/16/2015), Disp: 28 tablet, Rfl: 0 .  cyclobenzaprine (FLEXERIL) 10 MG tablet, Take 1 tablet (10 mg total) by mouth 3 (three) times daily as needed for muscle spasms. (Patient not taking: Reported on 11/16/2015), Disp: 30 tablet, Rfl: 0 .  HYDROcodone-acetaminophen (NORCO) 5-325 MG per tablet, Take 1 tablet by mouth every 6 (six) hours as needed for  moderate pain. (Patient not taking: Reported on 11/16/2015), Disp: 30 tablet, Rfl: 0 .  HYDROcodone-acetaminophen (NORCO) 5-325 MG per tablet, Take 1-2 tablets by mouth every 4 (four) hours as needed. (Patient not taking: Reported on 11/16/2015), Disp: 30 tablet, Rfl: 0 .  losartan-hydrochlorothiazide (HYZAAR) 100-12.5 MG per tablet, Take 1 tablet by mouth daily. (Patient not taking: Reported on 11/16/2015), Disp: 90 tablet, Rfl: 3 .  naproxen sodium (ANAPROX DS) 550 MG tablet, Take 1 tablet (550 mg total) by mouth 2 (two) times daily with a meal. (Patient not taking: Reported on 11/16/2015), Disp: 40 tablet, Rfl: 0 .  sildenafil (VIAGRA) 100 MG tablet, Take 0.5-1 tablets (50-100 mg total) by mouth daily as needed for erectile dysfunction. (Patient not taking: Reported on 11/16/2015), Disp: 5 tablet, Rfl: 11 .  tamsulosin (FLOMAX) 0.4 MG CAPS capsule, Take 1 capsule (0.4 mg total) by mouth daily after supper. (Patient not taking: Reported on 11/16/2015), Disp: 30 capsule, Rfl: 0 .  triamcinolone cream (KENALOG) 0.1 %, Apply 1 application topically 2 (two) times daily. (Patient not taking: Reported on 11/16/2015), Disp: 200 g, Rfl: 5 .  triamcinolone cream (KENALOG) 0.5 %, Apply topically 2 (two) times daily. (Patient not taking: Reported on 11/16/2015), Disp: 200 g, Rfl: 5 No Known Allergies  Review of Systems  Constitutional: Negative for fever, chills and fatigue.  Respiratory: Negative for cough, shortness of breath and wheezing.   Gastrointestinal:  Negative for nausea and vomiting.  Genitourinary: Positive for difficulty urinating and penile pain. Negative for flank pain.  Musculoskeletal: Negative for back pain.  Skin: Negative for rash.       Objective:   Physical Exam  Constitutional: He is oriented to person, place, and time. He appears well-developed and well-nourished. No distress.  HENT:  Head: Normocephalic and atraumatic.  Eyes: EOM are normal. Pupils are equal, round, and reactive to  light.  Neck: Neck supple.  Cardiovascular: Normal rate.   Pulmonary/Chest: Effort normal. No respiratory distress.  Abdominal: There is no CVA tenderness.  Musculoskeletal: Normal range of motion.  Neurological: He is alert and oriented to person, place, and time.  Skin: Skin is warm and dry.  Psychiatric: He has a normal mood and affect. His behavior is normal.  Nursing note and vitals reviewed.   BP 153/99 mmHg  Pulse 89  Temp(Src) 98 F (36.7 C) (Oral)  Resp 16  Ht 5\' 6"  (1.676 m)  Wt 187 lb (84.823 kg)  BMI 30.20 kg/m2  SpO2 98%   Results for orders placed or performed in visit on 11/16/15  POCT Microscopic Urinalysis ([facility])  Result Value Ref Range   WBC,UR,HPF,POC None None WBC/hpf   RBC,UR,HPF,POC Too numerous to count  (A) None RBC/hpf   Bacteria None None, Too numerous to count   Mucus Present (A) Absent   Epithelial Cells, UR Per Microscopy Moderate (A) None, Too numerous to count cells/hpf  POCT urinalysis dipstick  Result Value Ref Range   Color, UA yellow yellow   Clarity, UA cloudy (A) clear   Glucose, UA negative negative   Bilirubin, UA negative negative   Ketones, POC UA trace (5) (A) negative   Spec Grav, UA 1.025    Sullivan, UA large (A) negative   pH, UA 6.0    Protein Ur, POC =30 (A) negative   Urobilinogen, UA 0.2    Nitrite, UA Negative Negative   Leukocytes, UA Negative Negative       Assessment & Plan:   Dysuria - Plan: POCT Microscopic Urinalysis ([facility]), POCT urinalysis dipstick, Ambulatory referral to Urology  Kidney stones - Plan: Ambulatory referral to Urology  Hematuria - Plan: Ambulatory referral to Urology  Stop flomax  Meds ordered this encounter  Medications  . HYDROcodone-acetaminophen (NORCO) 5-325 MG tablet    Sig: Take 1-2 tablets by mouth every 4 (four) hours as needed.    Dispense:  30 tablet    Refill:  0  . phenazopyridine (PYRIDIUM) 100 MG tablet    Sig: Take 1 tablet (100 mg total) by mouth 3 (three) times daily  as needed for pain.    Dispense:  10 tablet    Refill:  0   I have completed the patient encounter in its entirety as documented by the scribe, with editing by me where necessary. Robert P. Laney Pastor, M.D.

## 2015-11-16 NOTE — Patient Instructions (Signed)
     IF you received an x-ray today, you will receive an invoice from [facility]. Please contact [facility] at [phone] with questions or concerns regarding your invoice.   IF you received labwork today, you will receive an invoice from [facility]. Please contact [facility] at [phone] with questions or concerns regarding your invoice.   Our billing staff will not be able to assist you with questions regarding bills from these companies.  You will be contacted with the lab results as soon as they are available. The fastest way to get your results is to activate your My Chart account. Instructions are located on the last page of this paperwork. If you have not heard from us regarding the results in 2 weeks, please contact this office.      

## 2015-11-30 ENCOUNTER — Other Ambulatory Visit: Payer: Self-pay | Admitting: Internal Medicine

## 2015-12-01 ENCOUNTER — Other Ambulatory Visit: Payer: Self-pay | Admitting: Internal Medicine

## 2015-12-07 ENCOUNTER — Other Ambulatory Visit: Payer: Self-pay | Admitting: Urology

## 2015-12-11 ENCOUNTER — Encounter (HOSPITAL_COMMUNITY): Payer: Self-pay

## 2015-12-11 NOTE — Progress Notes (Signed)
Completed patient history. There wasn't any language barrier over the phone

## 2015-12-14 ENCOUNTER — Ambulatory Visit (HOSPITAL_COMMUNITY)
Admission: RE | Admit: 2015-12-14 | Discharge: 2015-12-14 | Disposition: A | Discharge status: Home / Self Care | Payer: 59 | Source: Ambulatory Visit | Attending: Urology | Admitting: Urology

## 2015-12-14 ENCOUNTER — Encounter (HOSPITAL_COMMUNITY): Payer: Self-pay | Admitting: *Deleted

## 2015-12-14 ENCOUNTER — Ambulatory Visit (HOSPITAL_COMMUNITY): Payer: 59

## 2015-12-14 ENCOUNTER — Encounter (HOSPITAL_COMMUNITY)
Admission: RE | Disposition: A | Discharge status: Home / Self Care | Payer: Self-pay | Source: Ambulatory Visit | Attending: Urology

## 2015-12-14 DIAGNOSIS — N132 Hydronephrosis with renal and ureteral calculous obstruction: Secondary | ICD-10-CM | POA: Insufficient documentation

## 2015-12-14 DIAGNOSIS — N201 Calculus of ureter: Secondary | ICD-10-CM

## 2015-12-14 DIAGNOSIS — I10 Essential (primary) hypertension: Secondary | ICD-10-CM | POA: Diagnosis not present

## 2015-12-14 DIAGNOSIS — F17210 Nicotine dependence, cigarettes, uncomplicated: Secondary | ICD-10-CM | POA: Insufficient documentation

## 2015-12-14 SURGERY — LITHOTRIPSY, ESWL
Anesthesia: LOCAL | Laterality: Left

## 2015-12-14 MED ORDER — CIPROFLOXACIN HCL 500 MG PO TABS
500.0000 mg | ORAL_TABLET | ORAL | Status: AC
  Administered 2015-12-14: 500 mg via ORAL
  Filled 2015-12-14: qty 1

## 2015-12-14 MED ORDER — DIAZEPAM 5 MG PO TABS
10.0000 mg | ORAL_TABLET | ORAL | Status: AC
  Administered 2015-12-14: 10 mg via ORAL
  Filled 2015-12-14: qty 2

## 2015-12-14 MED ORDER — OXYCODONE-ACETAMINOPHEN 5-325 MG PO TABS: 1.0000 | ORAL_TABLET | Freq: Four times a day (QID) | ORAL | Status: DC | PRN

## 2015-12-14 MED ORDER — SODIUM CHLORIDE 0.9 % IV SOLN
INTRAVENOUS | Status: DC
  Administered 2015-12-14: 10:00:00 via INTRAVENOUS

## 2015-12-14 MED ORDER — DIPHENHYDRAMINE HCL 25 MG PO CAPS
25.0000 mg | ORAL_CAPSULE | ORAL | Status: AC
  Administered 2015-12-14: 25 mg via ORAL
  Filled 2015-12-14: qty 1

## 2015-12-14 NOTE — H&P (Signed)
Jackson Sullivan is an 54 y.o. male.    Chief Complaint: Pre-Op LEFT Shockwave Lithotripsy  HPI:   1 - Recurrent Nephrolithiasis - CT 11/2015 with LEFT 66mm UPJ stone with mild hydro and bilateral scattered lower pole stones and likely bladder stone. Left UPJ stone is 62mm, SSD 13cm, 1260HU.   Today "Jackson Sullivan" is seen to proceed with LEFT shockwave lithotripsy for his large left UPJ stone that is at least partially obstructing. No interval fevers.   Past Medical History  Diagnosis Date  . Hypertension   . Fungal infection   . Renal disorder     Past Surgical History  Procedure Laterality Date  . Testicle surgery    . Colonoscopy N/A 05/17/2013    Procedure: COLONOSCOPY;  Surgeon: Beryle Beams, MD;  Location: [facility];  Service: Endoscopy;  Laterality: N/A;    History reviewed. No pertinent family history. Social History:  reports that he has been smoking Cigarettes.  He has never used smokeless tobacco. He reports that he does not drink alcohol or use illicit drugs.  Allergies: No Known Allergies  No prescriptions prior to admission    No results found for this or any previous visit (from the past 48 hour(s)). No results found.  Review of Systems  Constitutional: Negative.  Negative for fever and chills.  HENT: Negative.   Eyes: Negative.   Respiratory: Negative.   Cardiovascular: Negative.   Gastrointestinal: Negative.   Genitourinary: Positive for flank pain.  Musculoskeletal: Negative.   Skin: Negative.   Neurological: Negative.   Endo/Heme/Allergies: Negative.   Psychiatric/Behavioral: Negative.     There were no vitals taken for this visit. Physical Exam  Constitutional: He appears well-developed.  HENT:  Head: Normocephalic.  Eyes: Pupils are equal, round, and reactive to light.  Neck: Normal range of motion.  Cardiovascular: Normal rate.   Respiratory: Effort normal.  GI: Soft.  Genitourinary:  Mild left CVAT. Some occasional urgency / freqeuncy.    Musculoskeletal: Normal range of motion.  Neurological: He is alert.  Skin: Skin is warm.  Psychiatric: He has a normal mood and affect. His behavior is normal. Judgment and thought content normal.     Assessment/Plan  1 - Recurrent Nephrolithiasis - rediscussed risks, benefits, alternatives and expcted peri-treatment course with shockwave lithotripsy and that he will still harbor small bilateral renal stones even if SWL is successful today. He voiced understanding and desire to proceed as planned.   Jackson Frock, MD 12/14/2015, 7:41 AM

## 2015-12-14 NOTE — Discharge Instructions (Signed)
1 - You may have urinary urgency (bladder spasms) and bloody urine on / off with passage of small stone fragments x few days. This is normal. ° °2 - Call MD or go to ER for fever >102, severe pain / nausea / vomiting not relieved by medications, or acute change in medical status ° °

## 2015-12-14 NOTE — Brief Op Note (Signed)
12/14/2015  11:56 AM  PATIENT:  Jackson Sullivan  55 y.o. male  PRE-OPERATIVE DIAGNOSIS:  LEFT URETERAL CALCULI  POST-OPERATIVE DIAGNOSIS:  * No post-op diagnosis entered *  PROCEDURE:  Procedure(s): LEFT EXTRACORPOREAL SHOCK WAVE LITHOTRIPSY (ESWL) (Left)  SURGEON:  Surgeon(s) and Role:    * Alexis Frock, MD - Primary  PHYSICIAN ASSISTANT:   ASSISTANTS: none   ANESTHESIA:   IV sedation  EBL:     BLOOD ADMINISTERED:none  DRAINS: none   LOCAL MEDICATIONS USED:  NONE  SPECIMEN:  No Specimen  DISPOSITION OF SPECIMEN:  N/A  COUNTS:  YES  TOURNIQUET:  * No tourniquets in log *  DICTATION: .Note written in paper chart  PLAN OF CARE: Discharge to home after PACU  PATIENT DISPOSITION:  Short Stay   Delay start of Pharmacological VTE agent (>24hrs) due to surgical blood loss or risk of bleeding: not applicable

## 2015-12-24 ENCOUNTER — Ambulatory Visit (INDEPENDENT_AMBULATORY_CARE_PROVIDER_SITE_OTHER): Payer: 59 | Admitting: Physician Assistant

## 2015-12-24 VITALS — BP 134/94 | HR 77 | Temp 98.3°F | Resp 18 | Ht 66.75 in | Wt 188.2 lb

## 2015-12-24 DIAGNOSIS — R21 Rash and other nonspecific skin eruption: Secondary | ICD-10-CM

## 2015-12-24 DIAGNOSIS — N210 Calculus in bladder: Secondary | ICD-10-CM | POA: Diagnosis not present

## 2015-12-24 DIAGNOSIS — N529 Male erectile dysfunction, unspecified: Secondary | ICD-10-CM

## 2015-12-24 DIAGNOSIS — N200 Calculus of kidney: Secondary | ICD-10-CM

## 2015-12-24 LAB — CBC
HEMATOCRIT: 48.6 % (ref 38.5–50.0)
HEMOGLOBIN: 16.9 g/dL (ref 13.2–17.1)
MCH: 30.2 pg (ref 27.0–33.0)
MCHC: 34.8 g/dL (ref 32.0–36.0)
MCV: 86.9 fL (ref 80.0–100.0)
MPV: 11.3 fL (ref 7.5–12.5)
Platelets: 218 10*3/uL (ref 140–400)
RBC: 5.59 MIL/uL (ref 4.20–5.80)
RDW: 13.4 % (ref 11.0–15.0)
WBC: 8.6 10*3/uL (ref 3.8–10.8)

## 2015-12-24 MED ORDER — TRIAMCINOLONE ACETONIDE 0.1 % EX CREA: 1.0000 "application " | TOPICAL_CREAM | Freq: Two times a day (BID) | CUTANEOUS | Status: DC

## 2015-12-24 MED ORDER — PHENAZOPYRIDINE HCL 100 MG PO TABS: 100.0000 mg | ORAL_TABLET | Freq: Three times a day (TID) | ORAL | Status: DC | PRN

## 2015-12-24 MED ORDER — HYDROCODONE-ACETAMINOPHEN 5-325 MG PO TABS: 1.0000 | ORAL_TABLET | ORAL | Status: DC | PRN

## 2015-12-24 MED ORDER — SILDENAFIL CITRATE 100 MG PO TABS: 50.0000 mg | ORAL_TABLET | Freq: Every day | ORAL | Status: DC | PRN

## 2015-12-24 NOTE — Progress Notes (Signed)
12/24/2015 1:24 PM   DOB: 23-Jul-1960 / MRN: [id number]  SUBJECTIVE:  Jackson Sullivan is a 55 y.o. male presenting for pain associated with urination.  States he has had kidney stones for 28 years now, and recently saw Urology on 7/10 for shock wave lithotripsy.  This is documented in [facility].  He reports passing multiple stones since this procedure and is going back in early August for another 18 x 11 mm stone in his bladder. Despite passing several stones he continue to complain of colicky pain, dysuria, transient nausea.  He feels "okay" at this moment. He denies fever, chills today.   He would like a refill of his Viagra today. He has not received a prescription for this since 2015 and this was last filled by Dr. Joseph Art. He is not prescribed nitrates and denies dizziness with medication administration.   He has an itchy rash on his bilateral legs and receives Kenalog cream for this.  Reports the rash is under control at this time however he is now out of cream.  He would like this refilled today.     He has No Known Allergies.   He  has a past medical history of Hypertension; Fungal infection; and Renal disorder.    He  reports that he has been smoking Cigarettes.  He has never used smokeless tobacco. He reports that he does not drink alcohol or use illicit drugs. He  has no sexual activity history on file. The patient  has past surgical history that includes Testicle surgery; Colonoscopy (N/A, 05/17/2013); and Kidney stone surgery (Left, 12/14/2015).  His family history is not on file.  Review of Systems  Constitutional: Negative for fever and chills.  Respiratory: Negative for cough.   Cardiovascular: Negative for chest pain.  Gastrointestinal: Negative for nausea.  Genitourinary: Positive for dysuria and hematuria. Negative for urgency, frequency and flank pain.  Musculoskeletal: Negative for myalgias.  Skin: Positive for rash. Negative for itching.  Neurological: Negative for  dizziness and headaches.    Problem list and medications reviewed and updated by myself where necessary, and exist elsewhere in the encounter.   OBJECTIVE:  BP 134/94 mmHg  Pulse 77  Temp(Src) 98.3 F (36.8 C) (Oral)  Resp 18  Ht 5' 6.75" (1.695 m)  Wt 188 lb 3.2 oz (85.367 kg)  BMI 29.71 kg/m2  SpO2 97%  Physical Exam  Constitutional: He is oriented to person, place, and time. He appears well-developed. He does not appear ill.  Eyes: Conjunctivae and EOM are normal. Pupils are equal, round, and reactive to light.  Cardiovascular: Normal rate.   Pulmonary/Chest: Effort normal and breath sounds normal.  Abdominal: Soft. Bowel sounds are normal. He exhibits no distension and no mass. There is no tenderness. There is no rebound and no guarding.  Musculoskeletal: Normal range of motion.  Neurological: He is alert and oriented to person, place, and time. No cranial nerve deficit. Coordination normal.  Skin: Skin is warm and dry. Rash (lichenified, non tender) noted. He is not diaphoretic. No erythema. No pallor.  Psychiatric: He has a normal mood and affect.  Nursing note and vitals reviewed.       No results found for this or any previous visit (from the past 72 hour(s)).  No results found.  ASSESSMENT AND PLAN  Adyan was seen today for abdominal pain and medication refill.  Diagnoses and all orders for this visit:  Calculus of urinary bladder: He has well documented urinary calculus with good follow up in  place. Will treat him symptomatically and advised he attend follow up.    -     phenazopyridine (PYRIDIUM) 100 MG tablet; Take 1 tablet (100 mg total) by mouth 3 (three) times daily as needed for pain. -     HYDROcodone-acetaminophen (NORCO) 5-325 MG tablet; Take 1 tablet by mouth every 4 (four) hours as needed for severe pain (May cause constipation). For severe pain only. -     CBC  Rash: HPI and exam consistent with eczema. Will refill cream.   -     Cancel: POCT  CBC -     triamcinolone cream (KENALOG) 0.1 %; Apply 1 application topically 2 (two) times daily.  Erectile dysfunction, unspecified erectile dysfunction type: Historical diagnosis.  Advised he avoid co administration  -     sildenafil (VIAGRA) 100 MG tablet; Take 0.5-1 tablets (50-100 mg total) by mouth daily as needed for erectile dysfunction (Do not take while taking Norco.).   The patient was advised to call or return to clinic if he does not see an improvement in symptoms, or to seek the care of the closest emergency department if he worsens with the above plan.   Philis Fendt, MHS, PA-C [facility] 12/24/2015 1:24 PM

## 2015-12-24 NOTE — Progress Notes (Deleted)
Subjective:    Patient ID: Jackson Sullivan, male    DOB: [birth year], 55 y.o.   MRN: [id number]  12/24/2015  Abdominal Pain and Medication Refill   HPI This 55 y.o. male presents for "kidney stones".  I need medication pain.  May 28, 2015.  Appointment for January 05, 2016 with urology.  +dysuria; pyridium; onset of dysuria daily for years chronic.  Dysuria occurring for 5-6 months.  Dr. Tresa Moore is urology.   S/p laser surgery on L for kidney stone.  May need repeat surgery in August to remove. No flank pain.  Pain with walking, standing up, sitting, picking up something on floor, pain worsens.  Only in genital area.  No fever/chills/sweats.  +nausea; vomited x 2 but minimal; vomited last last week with laser surgery.  Prescribed oxycodone after procedure; did not call requesting further pain medication; waiting on appointment.  Pain everyday.  Urinary leakage started two months ago.  Intermittent hematuria.  Nocturia x 2; baseline is 0.    At end of HPI, patient requested male provider.    PCP:  [facility]  Review of Systems  Constitutional: Negative for fever, chills, diaphoresis, activity change, appetite change and fatigue.  Respiratory: Negative for cough and shortness of breath.   Cardiovascular: Negative for chest pain, palpitations and leg swelling.  Gastrointestinal: Negative for nausea, vomiting, abdominal pain and diarrhea.  Endocrine: Negative for cold intolerance, heat intolerance, polydipsia, polyphagia and polyuria.  Skin: Negative for color change, rash and wound.  Neurological: Negative for dizziness, tremors, seizures, syncope, facial asymmetry, speech difficulty, weakness, light-headedness, numbness and headaches.  Psychiatric/Behavioral: Negative for sleep disturbance and dysphoric mood. The patient is not nervous/anxious.     Past Medical History  Diagnosis Date  . Hypertension   . Fungal infection   . Renal disorder    Past Surgical History  Procedure Laterality Date   . Testicle surgery    . Colonoscopy N/A 05/17/2013    Procedure: COLONOSCOPY;  Surgeon: Beryle Beams, MD;  Location: [facility];  Service: Endoscopy;  Laterality: N/A;  . Kidney stone surgery Left 12/14/2015   No Known Allergies Current Outpatient Prescriptions  Medication Sig Dispense Refill  . aspirin 81 MG tablet Take 81 mg by mouth daily.    Marland Kitchen oxyCODONE-acetaminophen (ROXICET) 5-325 MG tablet Take 1-2 tablets by mouth every 6 (six) hours as needed for moderate pain or severe pain. Post-op from kidney stone. 30 tablet 0  . cyclobenzaprine (FLEXERIL) 10 MG tablet Take 1 tablet (10 mg total) by mouth 3 (three) times daily as needed for muscle spasms. (Patient not taking: Reported on 11/16/2015) 30 tablet 0  . losartan-hydrochlorothiazide (HYZAAR) 100-12.5 MG per tablet Take 1 tablet by mouth daily. (Patient not taking: Reported on 11/16/2015) 90 tablet 3  . naproxen sodium (ANAPROX DS) 550 MG tablet Take 1 tablet (550 mg total) by mouth 2 (two) times daily with a meal. (Patient not taking: Reported on 11/16/2015) 40 tablet 0  . phenazopyridine (PYRIDIUM) 100 MG tablet Take 1 tablet (100 mg total) by mouth 3 (three) times daily as needed for pain. (Patient not taking: Reported on 12/24/2015) 10 tablet 0  . sildenafil (VIAGRA) 100 MG tablet Take 0.5-1 tablets (50-100 mg total) by mouth daily as needed for erectile dysfunction. (Patient not taking: Reported on 12/24/2015) 5 tablet 11  . triamcinolone cream (KENALOG) 0.1 % Apply 1 application topically 2 (two) times daily. (Patient not taking: Reported on 11/16/2015) 200 g 5   No current facility-administered medications for  this visit.   Social History   Social History  . Marital Status: Married    Spouse Name: N/A  . Number of Children: N/A  . Years of Education: N/A   Occupational History  . Not on file.   Social History Main Topics  . Smoking status: Current Every Day Smoker    Types: Cigarettes  . Smokeless tobacco: Never Used  .  Alcohol Use: No  . Drug Use: No  . Sexual Activity: Not on file   Other Topics Concern  . Not on file   Social History Narrative   History reviewed. No pertinent family history.     Objective:    BP 134/94 mmHg  Pulse 77  Temp(Src) 98.3 F (36.8 C) (Oral)  Resp 18  Ht 5' 6.75" (1.695 m)  Wt 188 lb 3.2 oz (85.367 kg)  BMI 29.71 kg/m2  SpO2 97% Physical Exam  Constitutional: He is oriented to person, place, and time. He appears well-developed and well-nourished. No distress.  HENT:  Head: Normocephalic and atraumatic.  Right Ear: External ear normal.  Left Ear: External ear normal.  Nose: Nose normal.  Mouth/Throat: Oropharynx is clear and moist.  Eyes: Conjunctivae and EOM are normal. Pupils are equal, round, and reactive to light.  Neck: Normal range of motion. Neck supple. Carotid bruit is not present. No thyromegaly present.  Cardiovascular: Normal rate, regular rhythm, normal heart sounds and intact distal pulses.  Exam reveals no gallop and no friction rub.   No murmur heard. Pulmonary/Chest: Effort normal and breath sounds normal. He has no wheezes. He has no rales.  Abdominal: Soft. Bowel sounds are normal. He exhibits no distension and no mass. There is no tenderness. There is no rebound and no guarding.  Lymphadenopathy:    He has no cervical adenopathy.  Neurological: He is alert and oriented to person, place, and time. No cranial nerve deficit.  Skin: Skin is warm and dry. No rash noted. He is not diaphoretic.  Psychiatric: He has a normal mood and affect. His behavior is normal.  Nursing note and vitals reviewed.  Results for orders placed or performed in visit on 11/16/15  POCT Microscopic Urinalysis ([facility])  Result Value Ref Range   WBC,UR,HPF,POC None None WBC/hpf   RBC,UR,HPF,POC Too numerous to count  (A) None RBC/hpf   Bacteria None None, Too numerous to count   Mucus Present (A) Absent   Epithelial Cells, UR Per Microscopy Moderate (A) None, Too  numerous to count cells/hpf  POCT urinalysis dipstick  Result Value Ref Range   Color, UA yellow yellow   Clarity, UA cloudy (A) clear   Glucose, UA negative negative   Bilirubin, UA negative negative   Ketones, POC UA trace (5) (A) negative   Spec Grav, UA 1.025    Blood, UA large (A) negative   pH, UA 6.0    Protein Ur, POC =30 (A) negative   Urobilinogen, UA 0.2    Nitrite, UA Negative Negative   Leukocytes, UA Negative Negative       Assessment & Plan:  No diagnosis found.  No orders of the defined types were placed in this encounter.   No orders of the defined types were placed in this encounter.    No Follow-up on file.    Toma Erichsen Elayne Guerin, M.D. [facility] [address] [place], [place]  [address] [phone] phone [phone] fax

## 2015-12-24 NOTE — Patient Instructions (Signed)
     IF you received an x-ray today, you will receive an invoice from [facility]. Please contact [facility] at [phone] with questions or concerns regarding your invoice.   IF you received labwork today, you will receive an invoice from [facility]. Please contact [facility] at [phone] with questions or concerns regarding your invoice.   Our billing staff will not be able to assist you with questions regarding bills from these companies.  You will be contacted with the lab results as soon as they are available. The fastest way to get your results is to activate your My Chart account. Instructions are located on the last page of this paperwork. If you have not heard from us regarding the results in 2 weeks, please contact this office.      

## 2015-12-25 ENCOUNTER — Telehealth: Payer: Self-pay

## 2015-12-25 NOTE — Telephone Encounter (Signed)
PA approved through covermymeds for Viagra through 12/24/16, case # [id number]. Notified pharm.

## 2016-01-08 ENCOUNTER — Other Ambulatory Visit: Payer: Self-pay | Admitting: Urology

## 2016-01-08 ENCOUNTER — Other Ambulatory Visit: Payer: Self-pay | Admitting: Physician Assistant

## 2016-01-08 DIAGNOSIS — N210 Calculus in bladder: Secondary | ICD-10-CM

## 2016-01-18 ENCOUNTER — Encounter (HOSPITAL_BASED_OUTPATIENT_CLINIC_OR_DEPARTMENT_OTHER): Payer: Self-pay | Admitting: *Deleted

## 2016-01-18 NOTE — Progress Notes (Signed)
NPO AFTER MN.  ARRIVE AT 0800.  NEEDS HG.  MAY TAKE HYDROCODONE IF NEEDED AM DOS W/ SIPS OF WATER.

## 2016-01-25 NOTE — H&P (Signed)
Office Visit Report     01/05/2016   --------------------------------------------------------------------------------   Jackson Sullivan  MRN: [id number]  PRIMARY CARE:  Nolene Ebbs, MD  DOB: [birth year], 55 year old Male  REFERRING:  Ammie Dalton, NP  SSN: -[id number]  PROVIDER:  Festus Aloe, M.D.    LOCATION:  [facility] [phone]   --------------------------------------------------------------------------------   CC: I have kidney stones.  HPI: Jackson Sullivan is a 55 year-old male established patient who is here for renal calculi.  The problem is on both sides. This is not his first kidney stone. He is not currently having flank pain, back pain, groin pain, nausea, vomiting, fever or chills. He has caught a stone in his urine strainer since his symptoms began.   He has had eswl and ureteroscopy for treatment of his stones in the past.   CT June 2017 revealed small right stones, large left UPJ stone and left lower pole stone. He underwent Left ESWL Dec 14, 2015 with good fragmentation of UPJ and LP stone. KUB today shows 8 mm left mid-ureter fragment.      CC: I have bladder stones.  HPI: His problem was diagnosed 11/26/2015.   Noted on KUB and CT June 2017. 18 mm.   Pain has dysuria and pain in perineum.     ALLERGIES: No Allergies    MEDICATIONS: Phenazopyridine Hcl 100 mg tablet  Aspirin 81 MG TABS Oral  Hydrocodone-Acetaminophen 5 mg-325 mg tablet     GU PSH: Renal ESWL - 12/14/2015, 2012      [place] Notes: Lithotripsy, No Surgical Problems   NON-GU PSH: No Non-GU PSH    GU PMH: Calculus Kidney and Ureter - 11/26/2015 Kidney Stone, Nephrolithiasis - 04/17/2014 Calculus Ureter, Right ureteral stone - 02/18/2014 Other microscopic hematuria, Microhematuria - 02/18/2014    NON-GU PMH: Encounter for general adult medical examination without abnormal findings, Encounter for preventive health examination - 02/18/2014 Personal history of other  diseases of the circulatory system, History of hypertension - 2014 Personal history of other endocrine, nutritional and metabolic disease, History of hypercholesterolemia - 2014    FAMILY HISTORY: No Family History    SOCIAL HISTORY: Marital Status: Married Current Smoking Status: Patient smokes. Has smoked since 11/04/1988. Smokes less than 1/2 pack per day.  Has never drank.  Does not drink caffeine.     Notes: Tobacco use, Marital History - Currently Married, Occupation:, Caffeine Use, Alcohol Use   REVIEW OF SYSTEMS:    GU Review Male:   Patient reports frequent urination and burning/ pain with urination. Patient denies hard to postpone urination, get up at night to urinate, leakage of urine, stream starts and stops, trouble starting your stream, have to strain to urinate , erection problems, and penile pain.  Gastrointestinal (Upper):   Patient denies nausea, vomiting, and indigestion/ heartburn.  Gastrointestinal (Lower):   Patient reports constipation. Patient denies diarrhea.  Constitutional:   Patient denies fever, night sweats, weight loss, and fatigue.  Skin:   Patient reports itching. Patient denies skin rash/ lesion.  Eyes:   Patient reports blurred vision. Patient denies double vision.  Ears/ Nose/ Throat:   Patient denies sore throat and sinus problems.  Hematologic/Lymphatic:   Patient denies swollen glands and easy bruising.  Cardiovascular:   Patient denies leg swelling and chest pains.  Respiratory:   Patient denies cough and shortness of breath.  Endocrine:   Patient denies excessive thirst.  Musculoskeletal:   Patient denies back pain and joint pain.  Neurological:   Patient denies headaches and dizziness.  Psychologic:   Patient denies depression and anxiety.   VITAL SIGNS:      01/05/2016 03:32 PM  BP 147/93 mmHg  Pulse 74 /min  Temperature 98.0 F / 37 C   MULTI-SYSTEM PHYSICAL EXAMINATION:    Constitutional: Well-nourished. No physical deformities. Normally  developed. Good grooming.  Neck: Neck symmetrical, not swollen. Normal tracheal position.  Respiratory: No labored breathing, no use of accessory muscles.   Cardiovascular: Normal temperature, normal extremity pulses, no swelling, no varicosities.  Skin: No paleness, no jaundice, no cyanosis. No lesion, no ulcer, no rash.  Neurologic / Psychiatric: Oriented to time, oriented to place, oriented to person. No depression, no anxiety, no agitation.     PAST DATA REVIEWED:  Source Of History:  Patient   PROCEDURES:         KUB - 74000  A single view of the abdomen is obtained.  Calculi:  large bladder stone. 8-9 mm stone left mid ureter. Smaller bilateral renal stones.                Urinalysis - 81003 Dipstick Dipstick Cont'd  Specimen: Voided Bilirubin: Neg  Color: Yellow Ketones: Neg  Appearance: Clear Blood: Neg  Specific Gravity: 1.025 Protein: Trace  pH: 6.0 Urobilinogen: 0.2  Glucose: Neg Nitrites: Neg    Leukocyte Esterase: Neg    ASSESSMENT:      ICD-10 Details  1 GU:   Kidney Stone - N20.0   2   Bladder Stone - N21.0    PLAN:           Schedule Return Visit: Next Available Appointment - Schedule Surgery          Document Letter(s):  Created for Patient: Clinical Summary         Notes:   bladder stone - discussed nature r/b/a to cystolithopaxy and he elects to proceed.   ureteral stone - left upj stone broke up well, will do rgp's ensure no fragments remain in ureter or could do URS if found.   kidney stones - stone burden low.   cc: Dr. Jeanie Cooks     * Signed by Festus Aloe, M.D. on 01/07/16 at 10:50 AM (EDT)*     The information contained in this medical record document is considered private and confidential patient information. This information can only be used for the medical diagnosis and/or medical services that are being provided by the patient's selected caregivers. This information can only be distributed outside of the patient's care if the  patient agrees and signs waivers of authorization for this information to be sent to an outside source or route.

## 2016-01-26 ENCOUNTER — Encounter (HOSPITAL_BASED_OUTPATIENT_CLINIC_OR_DEPARTMENT_OTHER)
Admission: RE | Disposition: A | Discharge status: Home / Self Care | Payer: Self-pay | Source: Ambulatory Visit | Attending: Urology

## 2016-01-26 ENCOUNTER — Ambulatory Visit (HOSPITAL_BASED_OUTPATIENT_CLINIC_OR_DEPARTMENT_OTHER): Payer: 59 | Admitting: Anesthesiology

## 2016-01-26 ENCOUNTER — Encounter (HOSPITAL_BASED_OUTPATIENT_CLINIC_OR_DEPARTMENT_OTHER): Payer: Self-pay | Admitting: *Deleted

## 2016-01-26 ENCOUNTER — Ambulatory Visit (HOSPITAL_BASED_OUTPATIENT_CLINIC_OR_DEPARTMENT_OTHER)
Admission: RE | Admit: 2016-01-26 | Discharge: 2016-01-26 | Disposition: A | Discharge status: Home / Self Care | Payer: 59 | Source: Ambulatory Visit | Attending: Urology | Admitting: Urology

## 2016-01-26 DIAGNOSIS — Z7982 Long term (current) use of aspirin: Secondary | ICD-10-CM | POA: Diagnosis not present

## 2016-01-26 DIAGNOSIS — N202 Calculus of kidney with calculus of ureter: Secondary | ICD-10-CM | POA: Insufficient documentation

## 2016-01-26 DIAGNOSIS — Z87442 Personal history of urinary calculi: Secondary | ICD-10-CM | POA: Insufficient documentation

## 2016-01-26 DIAGNOSIS — N210 Calculus in bladder: Secondary | ICD-10-CM | POA: Diagnosis not present

## 2016-01-26 DIAGNOSIS — N362 Urethral caruncle: Secondary | ICD-10-CM | POA: Insufficient documentation

## 2016-01-26 DIAGNOSIS — F17210 Nicotine dependence, cigarettes, uncomplicated: Secondary | ICD-10-CM | POA: Diagnosis not present

## 2016-01-26 HISTORY — DX: Dysuria: R30.0

## 2016-01-26 HISTORY — DX: Calculus of kidney: N20.0

## 2016-01-26 HISTORY — PX: STONE EXTRACTION WITH BASKET: SHX5318

## 2016-01-26 HISTORY — DX: Personal history of other diseases of the circulatory system: Z86.79

## 2016-01-26 HISTORY — PX: HOLMIUM LASER APPLICATION: SHX5852

## 2016-01-26 HISTORY — PX: URETEROSCOPY: SHX842

## 2016-01-26 HISTORY — PX: CYSTOSCOPY WITH LITHOLAPAXY: SHX1425

## 2016-01-26 HISTORY — PX: CYSTOSCOPY WITH STENT PLACEMENT: SHX5790

## 2016-01-26 HISTORY — PX: CYSTOSCOPY W/ RETROGRADES: SHX1426

## 2016-01-26 HISTORY — DX: Hematuria, unspecified: R31.9

## 2016-01-26 HISTORY — DX: Rash and other nonspecific skin eruption: R21

## 2016-01-26 HISTORY — DX: Personal history of urinary calculi: Z87.442

## 2016-01-26 HISTORY — DX: Calculus in bladder: N21.0

## 2016-01-26 HISTORY — DX: Frequency of micturition: R35.0

## 2016-01-26 HISTORY — DX: Male erectile dysfunction, unspecified: N52.9

## 2016-01-26 HISTORY — PX: CYSTOSCOPY WITH BIOPSY: SHX5122

## 2016-01-26 SURGERY — CYSTOSCOPY, WITH BLADDER CALCULUS LITHOLAPAXY
Anesthesia: General | Site: Renal

## 2016-01-26 MED ORDER — KETOROLAC TROMETHAMINE 30 MG/ML IJ SOLN
INTRAMUSCULAR | Status: AC
  Filled 2016-01-26: qty 1

## 2016-01-26 MED ORDER — CEFAZOLIN SODIUM-DEXTROSE 2-4 GM/100ML-% IV SOLN
2.0000 g | INTRAVENOUS | Status: AC
  Administered 2016-01-26: 2 g via INTRAVENOUS
  Filled 2016-01-26: qty 100

## 2016-01-26 MED ORDER — ONDANSETRON HCL 4 MG/2ML IJ SOLN
INTRAMUSCULAR | Status: DC | PRN
  Administered 2016-01-26: 4 mg via INTRAVENOUS

## 2016-01-26 MED ORDER — HYDROCODONE-ACETAMINOPHEN 5-325 MG PO TABS
1.0000 | ORAL_TABLET | ORAL | Status: DC | PRN
  Administered 2016-01-26: 1 via ORAL
  Filled 2016-01-26: qty 1

## 2016-01-26 MED ORDER — CEFAZOLIN IN D5W 1 GM/50ML IV SOLN
1.0000 g | INTRAVENOUS | Status: DC
  Filled 2016-01-26: qty 50

## 2016-01-26 MED ORDER — STERILE WATER FOR IRRIGATION IR SOLN
Status: DC | PRN
  Administered 2016-01-26: 3000 mL

## 2016-01-26 MED ORDER — FENTANYL CITRATE (PF) 100 MCG/2ML IJ SOLN
25.0000 ug | INTRAMUSCULAR | Status: DC | PRN
  Administered 2016-01-26: 50 ug via INTRAVENOUS
  Filled 2016-01-26: qty 1

## 2016-01-26 MED ORDER — PROPOFOL 10 MG/ML IV BOLUS
INTRAVENOUS | Status: DC | PRN
  Administered 2016-01-26: 70 mg via INTRAVENOUS
  Administered 2016-01-26: 200 mg via INTRAVENOUS

## 2016-01-26 MED ORDER — IOHEXOL 300 MG/ML  SOLN
INTRAMUSCULAR | Status: DC | PRN
  Administered 2016-01-26: 20 mL

## 2016-01-26 MED ORDER — PROMETHAZINE HCL 25 MG/ML IJ SOLN
6.2500 mg | INTRAMUSCULAR | Status: DC | PRN
  Filled 2016-01-26: qty 1

## 2016-01-26 MED ORDER — LIDOCAINE HCL (CARDIAC) 20 MG/ML IV SOLN
INTRAVENOUS | Status: AC
Start: 2016-01-26 — End: 2016-01-26
  Filled 2016-01-26: qty 5

## 2016-01-26 MED ORDER — BELLADONNA ALKALOIDS-OPIUM 16.2-60 MG RE SUPP
RECTAL | Status: AC
  Filled 2016-01-26: qty 1

## 2016-01-26 MED ORDER — PROPOFOL 10 MG/ML IV BOLUS
INTRAVENOUS | Status: AC
  Filled 2016-01-26: qty 40

## 2016-01-26 MED ORDER — MIDAZOLAM HCL 2 MG/2ML IJ SOLN
INTRAMUSCULAR | Status: AC
  Filled 2016-01-26: qty 2

## 2016-01-26 MED ORDER — DEXAMETHASONE SODIUM PHOSPHATE 4 MG/ML IJ SOLN
INTRAMUSCULAR | Status: DC | PRN
  Administered 2016-01-26: 10 mg via INTRAVENOUS

## 2016-01-26 MED ORDER — PHENAZOPYRIDINE HCL 100 MG PO TABS
ORAL_TABLET | ORAL | Status: AC
Start: 2016-01-26 — End: 2016-01-26
  Filled 2016-01-26: qty 1

## 2016-01-26 MED ORDER — LIDOCAINE 2% (20 MG/ML) 5 ML SYRINGE
INTRAMUSCULAR | Status: DC | PRN
  Administered 2016-01-26: 100 mg via INTRAVENOUS

## 2016-01-26 MED ORDER — ONDANSETRON HCL 4 MG/2ML IJ SOLN
INTRAMUSCULAR | Status: AC
  Filled 2016-01-26: qty 2

## 2016-01-26 MED ORDER — SODIUM CHLORIDE 0.9 % IR SOLN
Status: DC | PRN
  Administered 2016-01-26: 4000 mL

## 2016-01-26 MED ORDER — CEFAZOLIN SODIUM-DEXTROSE 2-4 GM/100ML-% IV SOLN
INTRAVENOUS | Status: AC
  Filled 2016-01-26: qty 100

## 2016-01-26 MED ORDER — PHENAZOPYRIDINE HCL 100 MG PO TABS
100.0000 mg | ORAL_TABLET | Freq: Three times a day (TID) | ORAL | Status: DC
  Administered 2016-01-26: 100 mg via ORAL
  Filled 2016-01-26: qty 1

## 2016-01-26 MED ORDER — KETOROLAC TROMETHAMINE 30 MG/ML IJ SOLN
INTRAMUSCULAR | Status: DC | PRN
  Administered 2016-01-26: 30 mg via INTRAVENOUS

## 2016-01-26 MED ORDER — FENTANYL CITRATE (PF) 100 MCG/2ML IJ SOLN
INTRAMUSCULAR | Status: DC | PRN
  Administered 2016-01-26 (×4): 50 ug via INTRAVENOUS

## 2016-01-26 MED ORDER — MIDAZOLAM HCL 5 MG/5ML IJ SOLN
INTRAMUSCULAR | Status: DC | PRN
  Administered 2016-01-26: 2 mg via INTRAVENOUS

## 2016-01-26 MED ORDER — OXYCODONE-ACETAMINOPHEN 5-325 MG PO TABS
ORAL_TABLET | ORAL | Status: AC
  Filled 2016-01-26: qty 1

## 2016-01-26 MED ORDER — FENTANYL CITRATE (PF) 100 MCG/2ML IJ SOLN
INTRAMUSCULAR | Status: AC
  Filled 2016-01-26: qty 2

## 2016-01-26 MED ORDER — DEXAMETHASONE SODIUM PHOSPHATE 10 MG/ML IJ SOLN
INTRAMUSCULAR | Status: AC
  Filled 2016-01-26: qty 1

## 2016-01-26 MED ORDER — HYDROCODONE-ACETAMINOPHEN 5-325 MG PO TABS
ORAL_TABLET | ORAL | Status: AC
  Filled 2016-01-26: qty 1

## 2016-01-26 MED ORDER — LACTATED RINGERS IV SOLN
INTRAVENOUS | Status: DC
  Administered 2016-01-26: 09:00:00 via INTRAVENOUS
  Filled 2016-01-26: qty 1000

## 2016-01-26 SURGICAL SUPPLY — 50 items
BAG DRAIN URO-CYSTO SKYTR STRL (DRAIN) ×6 IMPLANT
BAG DRN UROCATH (DRAIN) ×1
BASKET LASER NITINOL 1.9FR (BASKET) IMPLANT
BASKET STNLS GEMINI 4WIRE 3FR (BASKET) IMPLANT
BASKET ZERO TIP NITINOL 2.4FR (BASKET) ×6 IMPLANT
BSKT STON RTRVL 120 1.9FR (BASKET)
BSKT STON RTRVL GEM 120X11 3FR (BASKET)
BSKT STON RTRVL ZERO TP 2.4FR (BASKET) ×2
CATH FOLEY 2WAY SLVR  5CC 16FR (CATHETERS)
CATH FOLEY 2WAY SLVR 5CC 16FR (CATHETERS) IMPLANT
CATH INTERMIT  6FR 70CM (CATHETERS) IMPLANT
CATH URET 5FR 28IN CONE TIP (BALLOONS)
CATH URET 5FR 28IN OPEN ENDED (CATHETERS) IMPLANT
CATH URET 5FR 70CM CONE TIP (BALLOONS) IMPLANT
CATH URET DUAL LUMEN 6-10FR 50 (CATHETERS) IMPLANT
CLOTH BEACON ORANGE TIMEOUT ST (SAFETY) ×6 IMPLANT
ELECT REM PT RETURN 9FT ADLT (ELECTROSURGICAL) ×12
ELECTRODE REM PT RTRN 9FT ADLT (ELECTROSURGICAL) ×10 IMPLANT
FIBER LASER FLEXIVA 1000 (UROLOGICAL SUPPLIES) ×6 IMPLANT
FIBER LASER FLEXIVA 365 (UROLOGICAL SUPPLIES) IMPLANT
FIBER LASER TRAC TIP (UROLOGICAL SUPPLIES) ×6 IMPLANT
GLOVE BIO SURGEON STRL SZ7.5 (GLOVE) ×6 IMPLANT
GLOVE BIOGEL M 6.5 STRL (GLOVE) ×6 IMPLANT
GLOVE BIOGEL PI IND STRL 6.5 (GLOVE) ×10 IMPLANT
GLOVE BIOGEL PI INDICATOR 6.5 (GLOVE) ×2
GOWN STRL REUS W/ TWL LRG LVL3 (GOWN DISPOSABLE) ×5 IMPLANT
GOWN STRL REUS W/ TWL XL LVL3 (GOWN DISPOSABLE) ×5 IMPLANT
GOWN STRL REUS W/TWL LRG LVL3 (GOWN DISPOSABLE) ×3
GOWN STRL REUS W/TWL XL LVL3 (GOWN DISPOSABLE) ×4
GUIDEWIRE 0.038 PTFE COATED (WIRE) ×6 IMPLANT
GUIDEWIRE ANG ZIPWIRE 038X150 (WIRE) IMPLANT
GUIDEWIRE STR DUAL SENSOR (WIRE) ×6 IMPLANT
IV NS 1000ML (IV SOLUTION) ×1
IV NS 1000ML BAXH (IV SOLUTION) ×5 IMPLANT
IV NS IRRIG 3000ML ARTHROMATIC (IV SOLUTION) ×6 IMPLANT
KIT BALLIN UROMAX 15FX10 (LABEL) IMPLANT
KIT BALLN UROMAX 15FX4 (MISCELLANEOUS) IMPLANT
KIT BALLN UROMAX 26 75X4 (MISCELLANEOUS)
KIT ROOM TURNOVER WOR (KITS) ×6 IMPLANT
MANIFOLD NEPTUNE II (INSTRUMENTS) ×6 IMPLANT
NEEDLE HYPO 22GX1.5 SAFETY (NEEDLE) IMPLANT
NS IRRIG 500ML POUR BTL (IV SOLUTION) ×6 IMPLANT
PACK CYSTO (CUSTOM PROCEDURE TRAY) ×6 IMPLANT
SET HIGH PRES BAL DIL (LABEL)
SHEATH ACCESS URETERAL 38CM (SHEATH) IMPLANT
STENT URET 6FRX26 CONTOUR (STENTS) ×6 IMPLANT
SURGILUBE 2OZ TUBE FLIPTOP (MISCELLANEOUS) IMPLANT
SYRINGE IRR TOOMEY STRL 70CC (SYRINGE) IMPLANT
TUBE CONNECTING 12X1/4 (SUCTIONS) IMPLANT
WATER STERILE IRR 3000ML UROMA (IV SOLUTION) ×12 IMPLANT

## 2016-01-26 NOTE — Anesthesia Preprocedure Evaluation (Addendum)
Anesthesia Evaluation   Patient awake    Reviewed: Allergy & Precautions, NPO status , Patient's Chart, lab work & pertinent test results  Airway Mallampati: II  TM Distance: >3 FB Neck ROM: Full    Dental  (+) Partial Lower   Pulmonary Current Smoker,    Pulmonary exam normal        Cardiovascular negative cardio ROS Normal cardiovascular exam     Neuro/Psych negative neurological ROS  negative psych ROS   GI/Hepatic negative GI ROS, Neg liver ROS,   Endo/Other  negative endocrine ROS  Renal/GU Renal disease (renal stones)  negative genitourinary   Musculoskeletal   Abdominal   Peds negative pediatric ROS (+)  Hematology negative hematology ROS (+)   Anesthesia Other Findings   Reproductive/Obstetrics                           Anesthesia Physical Anesthesia Plan  ASA: II  Anesthesia Plan: General   Post-op Pain Management:    Induction: Intravenous  Airway Management Planned: LMA  Additional Equipment:   Intra-op Plan:   Post-operative Plan: Extubation in OR  Informed Consent: I have reviewed the patients History and Physical, chart, labs and discussed the procedure including the risks, benefits and alternatives for the proposed anesthesia with the patient or authorized representative who has indicated his/her understanding and acceptance.   Dental advisory given  Plan Discussed with:   Anesthesia Plan Comments:        Anesthesia Quick Evaluation

## 2016-01-26 NOTE — Op Note (Signed)
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 The left testicle was descended and palpably normal. The penis was circumcised and without mass or lesion. On digital rectal exam the prostate was approximately 40 g, smooth, no hard area or nodule.  Cystoscopy revealed a small polyp anteriorly at 12:00 in the bulbar urethra. The stone was noted to be in the bladder. There were no mucosal lesions or tumors.  Right retrograde pyelogram-there was a single ureter single collecting system unit without filling defect, stricture dilation  Left retrograde pyelogram-this outlined a filling defect in the mid ureter consistent with the stone. The proximal ureter and the collecting system filled normally. There was minimal dilation.  On ureteroscopy the stone was located in the left mid ureter.  Description of procedure: After consent was obtained the patient was brought to the operating room. After adequate anesthesia he is placed in lithotomy position and prepped and draped in the usual sterile fashion. A timeout was performed to confirm the patient and procedure. The cystoscope was passed per urethra and the bladder inspected. A 6 French open-ended catheter was used to cannulate the right ureteral orifice and retrograde injection of contrast was performed. The left ureteral orifice was also injected with contrast. A sensor wire was then advanced and coiled in the collecting system.   The semirigid ureteroscope was then advanced and the stone was located. The stone was dusted and also fragmented  at 0.3 and 50 as well as 0.8 and 8. The stone was quite dense. The fragments were extracted with the Nitinol basket and dropped in the bladder. I was able to get the semirigid just over the iliacs. I passed the wire up through the proximal ureter and brought it back and no other fragments were noted. The ureter was noted to be clear and without injury on the way out.   The cystoscope was then passed per urethra and the bladder stone fragmented at 0.4 and 50 and 1 and 8. The pieces were irrigated together and the bladder was clear.   The scope was then backed out and the small polyp was grasped with a flexible biopsy forceps. This area was then lightly fulgurated.   The wire was then backloaded on the cystoscope and a 6 x 26 cm stent was advanced on the left side. The wire was removed with a good coil seen in the kidney and a good coil in the bladder. The bladder was drained and the scope removed. Lidocaine jelly was instilled per urethra. The string was secured to the patient. He was awakened and taken to the recovery room in stable condition.  Complications: None  Blood loss: Minimal  Specimens: #1 stone fragments given the patient #2 urethral biopsy to pathology  Drains: 6 x 26 cm left ureteral stent with string

## 2016-01-26 NOTE — Transfer of Care (Signed)
Immediate Anesthesia Transfer of Care Note  Patient: Jackson Sullivan  Procedure(s) Performed: Procedure(s): CYSTOSCOPY WITH LITHOLAPAXY (N/A) HOLMIUM LASER APPLICATION (N/A) CYSTOSCOPY WITH RETROGRADE PYELOGRAM (Bilateral) CYSTOSCOPY WITH  URETEROSCOPY (Left) STONE EXTRACTION WITH BASKET (Left) CYSTOSCOPY WITH STENT PLACEMENT (Left) CYSTOSCOPY WITH  URETHRAL BIOPSY  Patient Location: PACU  Anesthesia Type:General  Level of Consciousness: awake and oriented  Airway & Oxygen Therapy: Patient Spontanous Breathing and Patient connected to nasal cannula oxygen  Post-op Assessment: Report given to RN  Post vital signs: Reviewed and stable  Last Vitals: 152/91, 76, 21, 97%, 98.5  Vitals:   01/26/16 0811  BP: 139/86  Pulse: 72  Resp: 16  Temp: 36.7 C    Last Pain:  Vitals:   01/26/16 0811  TempSrc: Oral      Patients Stated Pain Goal: 8 ([id number])  Complications: No apparent anesthesia complications

## 2016-01-26 NOTE — Interval H&P Note (Signed)
History and Physical Interval Note:  01/26/2016 9:41 AM  Jackson Sullivan  has presented today for surgery, with the diagnosis of BLADDER STONES AND KIDNEY STONES  The various methods of treatment have been discussed with the patient and family. After consideration of risks, benefits and other options for treatment, the patient has consented to  Procedure(s): CYSTOSCOPY WITH LITHOLAPAXY (N/A) HOLMIUM LASER APPLICATION (N/A) CYSTOSCOPY WITH RETROGRADE PYELOGRAM CYSTOSCOPY WITH POSSIBLE URETEROSCOPY as a surgical intervention . Pt passed multiple fragements and one large fragment that is in the shape of the one on the last kub left mid ureter. He also had right flank pain and thinks he passed a right stone. We discussed the bladder stone likely remains and he wants to treat this today. We also discussed some small remaining renal stones bilaterally and attempting bilateral URS. The pt declined URS. He has passed larger stones than he has now and he does not want to consider stents or a staged procedure. He wants the quickest recovery and he's only having frequency, urgency currently. Occasional dysuria is at baseline. No fever.  The patient's history has been reviewed, patient examined, no change in status, stable for surgery.  I have reviewed the patient's chart and labs.  Questions were answered to the patient's satisfaction.     [name]

## 2016-01-26 NOTE — Discharge Instructions (Signed)
[facility] [phone] Post Ureteroscopy With or Without Stent Instructions  Definitions:  Ureter: The duct that transports urine from the kidney to the bladder. Stent:   A plastic hollow tube that is placed into the ureter, from the kidney to the                 bladder to prevent the ureter from swelling shut.  GENERAL INSTRUCTIONS:  Despite the fact that no skin incisions were used, the area around the ureter and bladder is raw and irritated. The stent is a foreign body which will further irritate the bladder wall. This irritation is manifested by increased frequency of urination, both day and night, and by an increase in the urge to urinate. In some, the urge to urinate is present almost always. Sometimes the urge is strong enough that you may not be able to stop yourself from urinating. The only real cure is to remove the stent and then give time for the bladder wall to heal which can't be done until the danger of the ureter swelling shut has passed, which varies.  You may see some blood in your urine while the stent is in place and a few days afterwards. Do not be alarmed, even if the urine was clear for a while. Get off your feet and drink lots of fluids until clearing occurs. If you start to pass clots or don't improve, call us.  DIET: You may return to your normal diet immediately. Because of the raw surface of your bladder, alcohol, spicy foods, acid type foods and drinks with caffeine may cause irritation or frequency and should be used in moderation. To keep your urine flowing freely and to avoid constipation, drink plenty of fluids during the day ( 8-10 glasses ). Tip: Avoid cranberry juice because it is very acidic.  ACTIVITY: Your physical activity doesn't need to be restricted. However, if you are very active, you may see some blood in your urine. We suggest that you reduce your activity under these circumstances until the bleeding has stopped.  BOWELS: It is  important to keep your bowels regular during the postoperative period. Straining with bowel movements can cause bleeding. A bowel movement every other day is reasonable. Use a mild laxative if needed, such as Milk of Magnesia 2-3 tablespoons, or 2 Dulcolax tablets. Call if you continue to have problems. If you have been taking narcotics for pain, before, during or after your surgery, you may be constipated. Take a laxative if necessary.   MEDICATION: You should resume your pre-surgery medications unless told not to. In addition you will often be given an antibiotic to prevent infection. These should be taken as prescribed until the bottles are finished unless you are having an unusual reaction to one of the drugs.  PROBLEMS YOU SHOULD REPORT TO [place]:  Fevers over 100.5 Fahrenheit.  Heavy bleeding, or clots ( See above notes about blood in urine ).  Inability to urinate.  Drug reactions ( hives, rash, nausea, vomiting, diarrhea ).  Severe burning or pain with urination that is not improving.  FOLLOW-UP: You will need a follow-up appointment to monitor your progress. Call for this appointment at the number listed above. Usually the first appointment will be about three to fourteen days after your surgery.     Ureteral Stent Implantation, Care After Refer to this sheet in the next few weeks. These instructions provide you with information on caring for yourself after your procedure. Your health care provider may  also give you more specific instructions. Your treatment has been planned according to current medical practices, but problems sometimes occur. Call your health care provider if you have any problems or questions after your procedure. WHAT TO EXPECT AFTER THE PROCEDURE You should be back to normal activity within 48 hours after the procedure. Nausea and vomiting may occur and are commonly the result of anesthesia. It is common to experience sharp pain in the back or lower abdomen and  penis with voiding. This is caused by movement of the ends of the stent with the act of urinating.It usually goes away within minutes after you have stopped urinating.  REMOVAL OF THE STENT: Remove the stent by pulling the string Friday morning, 01/29/2016. Be sure to keep all follow-up appointments so your health care provider can check that you are healing properly.  HOME CARE INSTRUCTIONS Make sure to drink plenty of fluids. You may have small amounts of bleeding, causing your urine to be red. This is normal. Certain movements may trigger pain or a feeling that you need to urinate. You may be given medicines to prevent infection or bladder spasms. Be sure to take all medicines as directed. Only take over-the-counter or prescription medicines for pain, discomfort, or fever as directed by your health care provider. Do not take aspirin, as this can make bleeding worse.   SEEK MEDICAL CARE IF:  You experience increasing pain.  Your pain medicine is not working. SEEK IMMEDIATE MEDICAL CARE IF:  Your urine is dark red or has blood clots.  You are leaking urine (incontinent).  You have a fever, chills, feeling sick to your stomach (nausea), or vomiting.  Your pain is not relieved by pain medicine.  The end of the stent comes out of the urethra.  You are unable to urinate.   This information is not intended to replace advice given to you by your health care provider. Make sure you discuss any questions you have with your health care provider.   Document Released: 01/23/2013 Document Revised: 05/28/2013 Document Reviewed: 12/05/2014 Elsevier Interactive Patient Education 2016 [place] Instructions  Activity: Get plenty of rest for the remainder of the day. A responsible adult should stay with you for 24 hours following the procedure.  For the next 24 hours, DO NOT: -Drive a car -Paediatric nurse -Drink alcoholic beverages -Take any medication  unless instructed by your physician -Make any legal decisions or sign important papers.  Meals: Start with liquid foods such as gelatin or soup. Progress to regular foods as tolerated. Avoid greasy, spicy, heavy foods. If nausea and/or vomiting occur, drink only clear liquids until the nausea and/or vomiting subsides. Call your physician if vomiting continues.  Special Instructions/Symptoms: Your throat may feel dry or sore from the anesthesia or the breathing tube placed in your throat during surgery. If this causes discomfort, gargle with warm salt water. The discomfort should disappear within 24 hours.  If you had a scopolamine patch placed behind your ear for the management of post- operative nausea and/or vomiting:  1. The medication in the patch is effective for 72 hours, after which it should be removed.  Wrap patch in a tissue and discard in the trash. Wash hands thoroughly with soap and water. 2. You may remove the patch earlier than 72 hours if you experience unpleasant side effects which may include dry mouth, dizziness or visual disturbances. 3. Avoid touching the patch. Wash your hands with soap and water after contact  patch. °  ° ° °

## 2016-01-26 NOTE — Anesthesia Postprocedure Evaluation (Signed)
Anesthesia Post Note  Patient: Jackson Sullivan  Procedure(s) Performed: Procedure(s) (LRB): CYSTOSCOPY WITH LITHOLAPAXY (N/A) HOLMIUM LASER APPLICATION (N/A) CYSTOSCOPY WITH RETROGRADE PYELOGRAM (Bilateral) CYSTOSCOPY WITH  URETEROSCOPY (Left) STONE EXTRACTION WITH BASKET (Left) CYSTOSCOPY WITH STENT PLACEMENT (Left) CYSTOSCOPY WITH  URETHRAL BIOPSY  Patient location during evaluation: PACU Anesthesia Type: General Level of consciousness: awake and alert Pain management: pain level controlled Vital Signs Assessment: post-procedure vital signs reviewed and stable Respiratory status: spontaneous breathing, nonlabored ventilation, respiratory function stable and patient connected to nasal cannula oxygen Cardiovascular status: blood pressure returned to baseline and stable Postop Assessment: no signs of nausea or vomiting Anesthetic complications: no    Last Vitals:  Vitals:   01/26/16 1200 01/26/16 1205  BP: (!) 162/96   Pulse: 82 88  Resp: (!) 22 18  Temp:      Last Pain:  Vitals:   01/26/16 1205  TempSrc:   PainSc: 4                  Shea Swalley Minda Meo

## 2016-01-27 ENCOUNTER — Encounter (HOSPITAL_BASED_OUTPATIENT_CLINIC_OR_DEPARTMENT_OTHER): Payer: Self-pay | Admitting: Urology

## 2016-01-27 LAB — POCT HEMOGLOBIN-HEMACUE: Hemoglobin: 14.9 g/dL (ref 13.0–17.0)

## 2017-06-23 IMAGING — DX DG ABDOMEN 1V
2 series · 2 of 2 positions shown · non-contrast
Comparison: CT abdomen and pelvis 11/30/2015

CLINICAL DATA: Preoperative evaluation for LEFT ureteral calculus

EXAM:
ABDOMEN - 1 VIEW

[abdomen kub (1 of 2)]
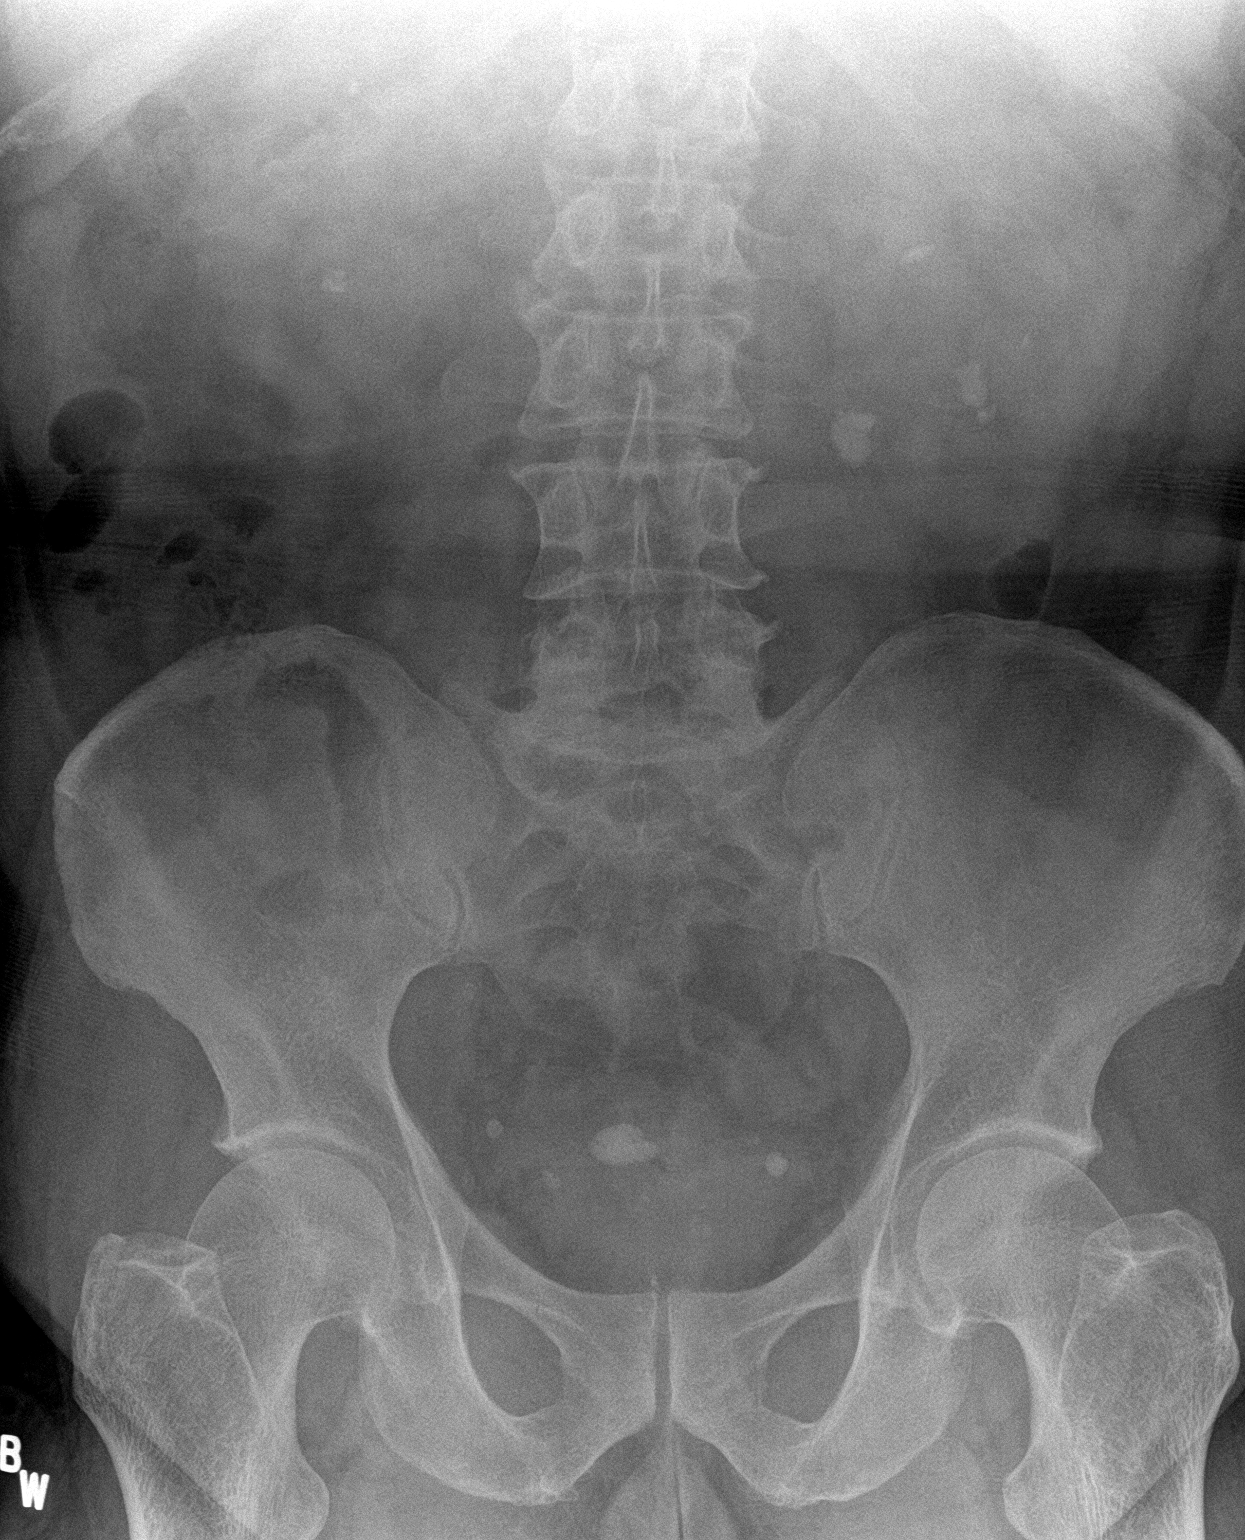

[abdomen kub (2 of 2)]
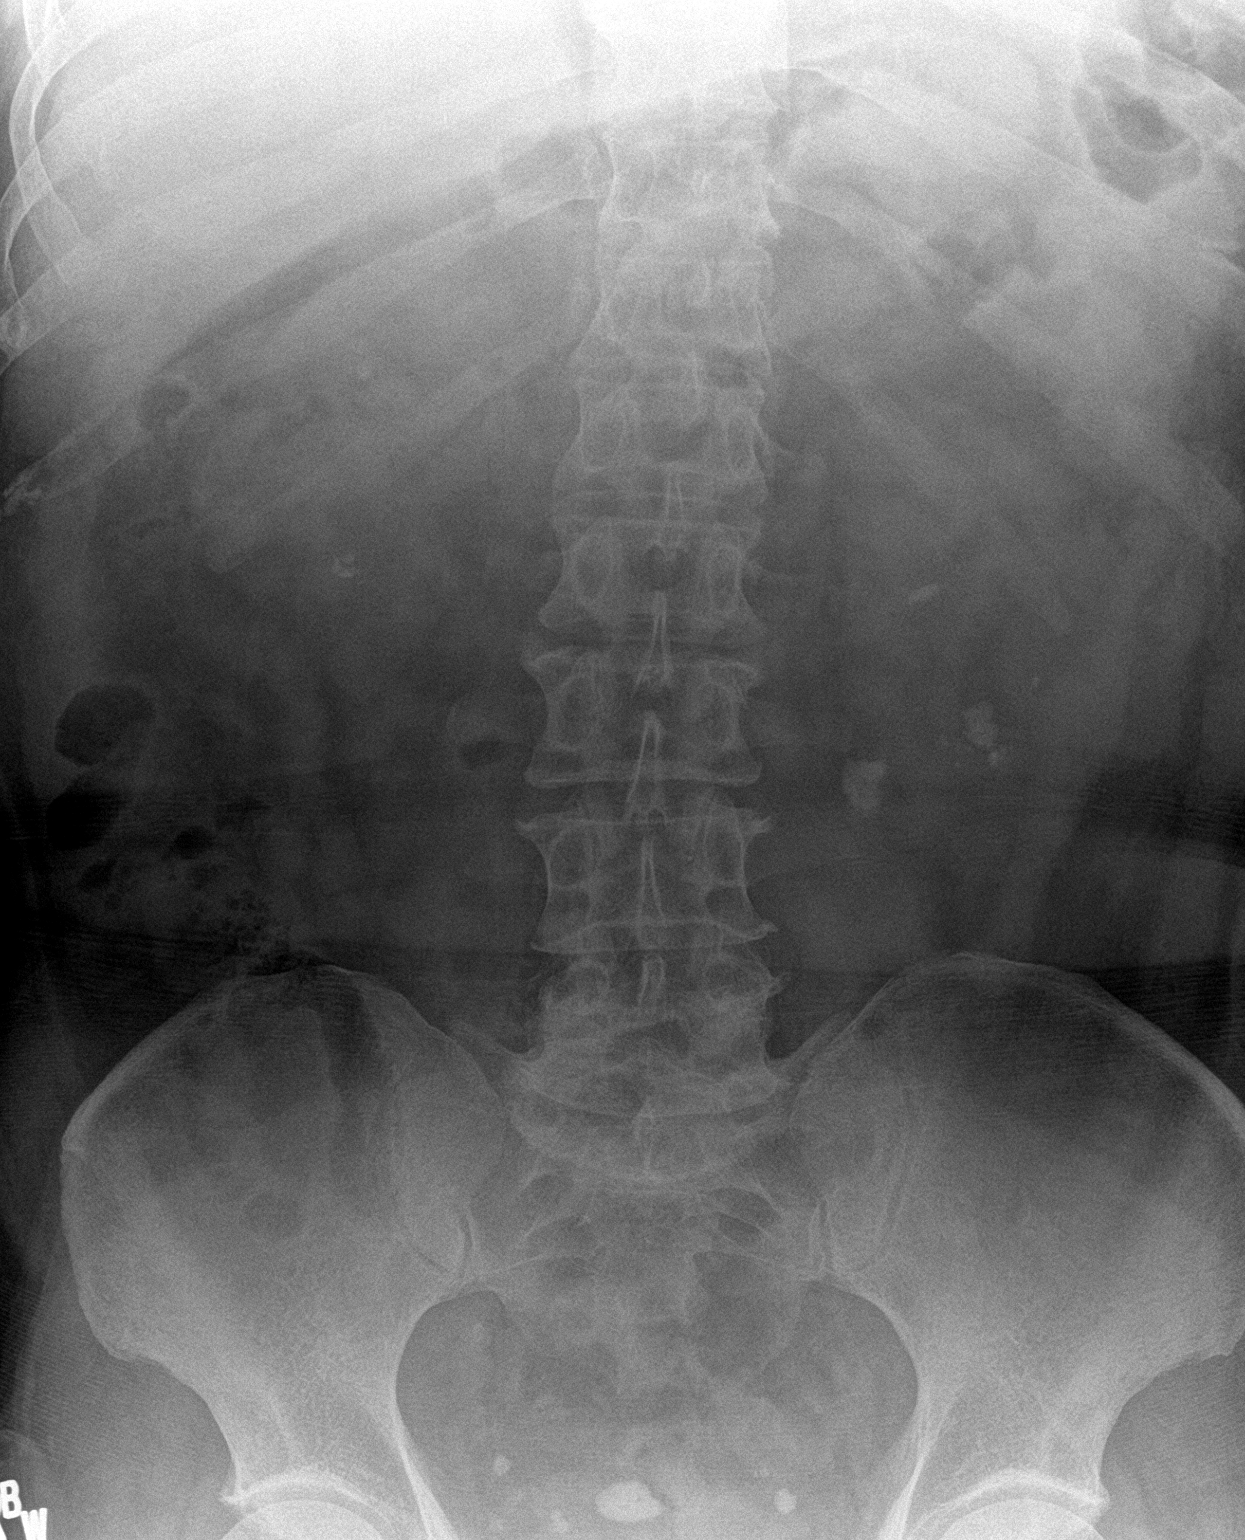

[2 of 2 positions shown; findings below may reference images not displayed]

FINDINGS: Multiple BILATERAL renal calculi.

Largest RIGHT renal calculus 8 x 6 mm.

Largest LEFT renal calculus 12 x 7 mm.

Additional large LEFT UPJ calculus 14 x 12 mm.

Large urinary bladder calculus 18 x 11 mm.

Bowel gas pattern normal.

BILATERAL pelvic phleboliths.

Osseous structures unremarkable.
IMPRESSION: BILATERAL renal calculi as above.

14 x 12 mm LEFT UPJ calculus.

18 x 11 mm bladder calculus.

## 2019-09-26 ENCOUNTER — Ambulatory Visit (HOSPITAL_COMMUNITY)
Admission: EM | Admit: 2019-09-26 | Discharge: 2019-09-26 | Disposition: A | Discharge status: Home / Self Care | Payer: Self-pay

## 2019-09-26 ENCOUNTER — Other Ambulatory Visit: Payer: Self-pay

## 2019-09-26 ENCOUNTER — Encounter (HOSPITAL_COMMUNITY): Payer: Self-pay

## 2019-09-26 DIAGNOSIS — R300 Dysuria: Secondary | ICD-10-CM | POA: Insufficient documentation

## 2019-09-26 DIAGNOSIS — R310 Gross hematuria: Secondary | ICD-10-CM | POA: Insufficient documentation

## 2019-09-26 DIAGNOSIS — R808 Other proteinuria: Secondary | ICD-10-CM | POA: Insufficient documentation

## 2019-09-26 DIAGNOSIS — R109 Unspecified abdominal pain: Secondary | ICD-10-CM | POA: Insufficient documentation

## 2019-09-26 LAB — POCT URINALYSIS DIP (DEVICE)
Bilirubin Urine: NEGATIVE
Glucose, UA: NEGATIVE mg/dL
Ketones, ur: NEGATIVE mg/dL
Nitrite: NEGATIVE
Protein, ur: 100 mg/dL — AB
Specific Gravity, Urine: 1.025 (ref 1.005–1.030)
Urobilinogen, UA: 0.2 mg/dL (ref 0.0–1.0)
pH: 6 (ref 5.0–8.0)

## 2019-09-26 MED ORDER — PHENAZOPYRIDINE HCL 200 MG PO TABS: 200.0000 mg | ORAL_TABLET | Freq: Three times a day (TID) | ORAL | 0 refills | Status: DC

## 2019-09-26 MED ORDER — HYDROCODONE-ACETAMINOPHEN 5-325 MG PO TABS: 2.0000 | ORAL_TABLET | ORAL | 0 refills | Status: DC | PRN

## 2019-09-26 MED ORDER — TAMSULOSIN HCL 0.4 MG PO CAPS: 0.4000 mg | ORAL_CAPSULE | Freq: Every day | ORAL | 3 refills | Status: DC

## 2019-09-26 NOTE — ED Provider Notes (Signed)
[facility]    CSN: [id number] Arrival date & time: 09/26/19  1006      History   Chief Complaint Chief Complaint  Patient presents with  . Dysuria  . Pelvic Pain    HPI Jackson Sullivan is a 59 y.o. male.   Patient reports that he has been having dysuria with pelvic pain, gross hematuria for about the last 6 months.  Patient has significant history for lithotripsy and surgical stents in his kidneys, kidney stones, bladder stones.  Patient also reports extreme urgency and frequency, so much that while he is at work he has to wear adult diapers because he cannot make it to the bathroom in time.  Patient states that he did not seek care previously because he was afraid of COVID-19 and he also does not have great health insurance.  Patient does not have an established relationship with [facility].  Patient reports that the pain has been gradually increasing over the last month or so.  He has not been taking anything over-the-counter to try to help relieve dysuria.  He also does not have any prescription medications at this time to help either.  Patient is also a current daily smoker.  Denies headache, shortness of breath, sore throat, cough, nausea, vomiting, diarrhea, rash, fever, other symptoms.  ROS per HPI  The history is provided by the patient. No language interpreter was used (declined).    Past Medical History:  Diagnosis Date  . Bladder stones   . Dysuria   . ED (erectile dysfunction)   . Frequency of urination   . Hematuria   . History of hypertension    PER PT 2015  NO LONGER AN ISSUE  . History of kidney stones   . Nephrolithiasis    BILATERAL   . Rash    LEGS     Patient Active Problem List   Diagnosis Date Noted  . Kidney stones 11/16/2015    Past Surgical History:  Procedure Laterality Date  . COLONOSCOPY N/A 05/17/2013   Procedure: COLONOSCOPY;  Surgeon: Beryle Beams, MD;  Location: [facility];  Service: Endoscopy;  Laterality: N/A;    . CYSTOSCOPY W/ RETROGRADES Bilateral 01/26/2016   Procedure: CYSTOSCOPY WITH RETROGRADE PYELOGRAM;  Surgeon: Festus Aloe, MD;  Location[facility];  Service: Urology;  Laterality: Bilateral;  . CYSTOSCOPY WITH BIOPSY  01/26/2016   Procedure: CYSTOSCOPY WITH  URETHRAL BIOPSY;  Surgeon: Festus Aloe, MD;  Location[facility];  Service: Urology;;  . Consuela Mimes WITH LITHOLAPAXY N/A 01/26/2016   Procedure: CYSTOSCOPY WITH LITHOLAPAXY;  Surgeon: Festus Aloe, MD;  Location: [facility];  Service: Urology;  Laterality: N/A;  . CYSTOSCOPY WITH STENT PLACEMENT Left 01/26/2016   Procedure: CYSTOSCOPY WITH STENT PLACEMENT;  Surgeon: Festus Aloe, MD;  Location: [facility];  Service: Urology;  Laterality: Left;  . EXTRACORPOREAL SHOCK WAVE LITHOTRIPSY Left 12-14-2015;   03-07-2011  . HOLMIUM LASER APPLICATION N/A [id number]   Procedure: HOLMIUM LASER APPLICATION;  Surgeon: Festus Aloe, MD;  Location: [facility];  Service: Urology;  Laterality: N/A;  . [place]    in [place]  . STONE EXTRACTION WITH BASKET Left 01/26/2016   Procedure: STONE EXTRACTION WITH BASKET;  Surgeon: Festus Aloe, MD;  Location: [facility];  Service: Urology;  Laterality: Left;  . URETEROSCOPY Left 01/26/2016   Procedure: CYSTOSCOPY WITH  URETEROSCOPY;  Surgeon: Festus Aloe, MD;  Location: [facility];  Service: Urology;  Laterality: Left;       Home Medications    Prior to Admission medications   Medication Sig Start Date End Date Taking? Authorizing Provider  acetaminophen (TYLENOL) 500 MG tablet Take 500 mg by mouth every 6 (six) hours as needed.   Yes [provider]  aspirin 81 MG tablet Take 81 mg by mouth daily.    [provider]  HYDROcodone-acetaminophen (NORCO/VICODIN) 5-325 MG tablet Take 2 tablets by mouth every 4 (four) hours as needed. 09/26/19    Faustino Congress, NP  phenazopyridine (PYRIDIUM) 200 MG tablet Take 1 tablet (200 mg total) by mouth 3 (three) times daily. 09/26/19   Faustino Congress, NP  sildenafil (VIAGRA) 100 MG tablet Take 0.5-1 tablets (50-100 mg total) by mouth daily as needed for erectile dysfunction (Do not take while taking Norco.). 12/24/15   Tereasa Coop, PA-C  tamsulosin (FLOMAX) 0.4 MG CAPS capsule Take 1 capsule (0.4 mg total) by mouth daily. 09/26/19   Faustino Congress, NP  triamcinolone cream (KENALOG) 0.1 % Apply 1 application topically 2 (two) times daily. 12/24/15   Tereasa Coop, PA-C    Family History History reviewed. No pertinent family history.  Social History Social History   Tobacco Use  . Smoking status: Current Every Day Smoker    Packs/day: 0.25    Years: 25.00    Pack years: 6.25    Types: Cigarettes  . Smokeless tobacco: Never Used  . Tobacco comment: 3-4 CIG PER DAY  Substance Use Topics  . Alcohol use: No    Alcohol/week: 0.0 standard drinks  . Drug use: No     Allergies   Patient has no known allergies.   Review of Systems Review of Systems   Physical Exam Triage Vital Signs ED Triage Vitals  Enc Vitals Group     BP 09/26/19 1050 (!) 162/93     Pulse Rate 09/26/19 1050 73     Resp 09/26/19 1050 18     Temp 09/26/19 1050 98.1 F (36.7 C)     Temp Source 09/26/19 1050 Oral     SpO2 09/26/19 1050 100 %     Weight --      Height --      Head Circumference --      Peak Flow --      Pain Score 09/26/19 1046 8     Pain Loc --      Pain Edu? --      Excl. in [place]? --    No data found.  Updated Vital Signs BP (!) 162/93 (BP Location: Left Arm)   Pulse 73   Temp 98.1 F (36.7 C) (Oral)   Resp 18   SpO2 100%      Physical Exam Vitals and nursing note reviewed.  Constitutional:      General: He is not in acute distress.    Appearance: Normal appearance. He is well-developed and normal weight. He is not ill-appearing.  HENT:     Head:  Normocephalic and atraumatic.  Eyes:     Extraocular Movements: Extraocular movements intact.     Conjunctiva/sclera: Conjunctivae normal.     Pupils: Pupils are equal, round, and reactive to light.  Cardiovascular:     Rate and Rhythm: Normal rate and regular rhythm.     Heart sounds: No murmur.  Pulmonary:     Effort: Pulmonary effort is normal. No respiratory distress.     Breath sounds: Normal breath sounds.  Abdominal:  General: There is no distension.     Palpations: Abdomen is soft. There is no mass.     Tenderness: There is abdominal tenderness (suprapubic). There is left CVA tenderness. There is no right CVA tenderness, guarding or rebound.     Hernia: No hernia is present.  Musculoskeletal:        General: Normal range of motion.     Cervical back: Normal range of motion and neck supple.  Skin:    General: Skin is warm and dry.     Capillary Refill: Capillary refill takes less than 2 seconds.  Neurological:     General: No focal deficit present.     Mental Status: He is alert and oriented to person, place, and time.  Psychiatric:        Mood and Affect: Mood normal.        Thought Content: Thought content normal.      UC Treatments / Results  Labs (all labs ordered are listed, but only abnormal results are displayed) Labs Reviewed  POCT URINALYSIS DIP (DEVICE) - Abnormal; Notable for the following components:      Result Value   Hgb urine dipstick LARGE (*)    Protein, ur 100 (*)    Leukocytes,Ua TRACE (*)    All other components within normal limits  URINE CULTURE    EKG   Radiology No results found.  Procedures Procedures (including critical care time)  Medications Ordered in UC Medications - No data to display  Initial Impression / Assessment and Plan / UC Course  I have reviewed the triage vital signs and the nursing notes.  Pertinent labs & imaging results that were available during my care of the patient were reviewed by me and considered  in my medical decision making (see chart for details).     Dysuria: Presents with history of chronic dysuria, has been worse in the last 2 to 3 weeks.  He has significant history of kidney stones.  Has history of stents placed.  Has history of lithotripsy as well.  UA in office with large blood, 100 protein, trace leuks.  Will culture just to be sure there is no infection.  Most likely kidney stones again.  Prescribed Flomax 0.4 mg 1 tablet daily to help pass kidney stones.  Prescribed Pyridium 200 mg once every 6 hours as needed for dysuria.  Discussed that this medication can change the patient's urine to orange color.  Also prescribed Norco 5-325 mg every 4-6 hours as needed for moderate to severe pain.  Instructed patient that he needs to go ahead and call urology so that he may be seen in there again.  There may be a possibility that this is a bigger stone that he cannot pass on its own.  If pain is getting worse, if he is unable to void, he is having chills, high fever, other concerning symptoms patient to report to the ER for further evaluation and treatment.  Patient verbalized understanding is in agreement with treatment plan. Final Clinical Impressions(s) / UC Diagnoses   Final diagnoses:  Dysuria  Gross hematuria  Other proteinuria     Discharge Instructions     You are likely experiencing kidney stones again.  I have sent in Flomax for you to take daily, Pyridium for you to take as needed for bladder pain and spasms, I have also sent in a medication for you to take to help you pass the stones.  If these things are not effective, call  your urologist and get in with him.    ED Prescriptions    Medication Sig Dispense Auth. Provider   phenazopyridine (PYRIDIUM) 200 MG tablet Take 1 tablet (200 mg total) by mouth 3 (three) times daily. 6 tablet Faustino Congress, NP   tamsulosin (FLOMAX) 0.4 MG CAPS capsule Take 1 capsule (0.4 mg total) by mouth daily. 30 capsule Faustino Congress, NP   HYDROcodone-acetaminophen (NORCO/VICODIN) 5-325 MG tablet Take 2 tablets by mouth every 4 (four) hours as needed. 10 tablet Faustino Congress, NP     I have reviewed the PDMP during this encounter.   Faustino Congress, NP 09/27/19 1224

## 2019-09-26 NOTE — ED Triage Notes (Addendum)
Pt presents to UC with pain when urinating, pelvic pain and blood in his urine since November 2020. Pt states the pain is worse when he sits down and when he get up he needs to urinates immediately. Pt reports he is wearing diapers all the time. Pt reports he was too scared to visit any medical facility because of [place].

## 2019-09-26 NOTE — Discharge Instructions (Addendum)
You are likely experiencing kidney stones again.  I have sent in Flomax for you to take daily, Pyridium for you to take as needed for bladder pain and spasms, I have also sent in a medication for you to take to help you pass the stones.  If these things are not effective, call your urologist and get in with him.

## 2019-09-27 LAB — URINE CULTURE: Culture: NO GROWTH

## 2019-10-01 ENCOUNTER — Other Ambulatory Visit: Payer: Self-pay | Admitting: Urology

## 2019-10-11 ENCOUNTER — Encounter (HOSPITAL_BASED_OUTPATIENT_CLINIC_OR_DEPARTMENT_OTHER): Payer: Self-pay | Admitting: Urology

## 2019-10-14 ENCOUNTER — Other Ambulatory Visit: Payer: Self-pay

## 2019-10-14 ENCOUNTER — Encounter (HOSPITAL_BASED_OUTPATIENT_CLINIC_OR_DEPARTMENT_OTHER): Payer: Self-pay | Admitting: Urology

## 2019-10-14 NOTE — Progress Notes (Signed)
Spoke w/ via phone for pre-op interview---patient  Lab needs dos----  none             COVID test ------10-15-2019 at 940 Arrive at -------1130 am 10-18-2019 No food after midnight, water only until 730 am day of surgery then npo Medications to take morning of surgery -----tamsulosin Diabetic medication -----n/a Patient Special Instructions -----none Pre-Op special Istructions -----none Patient verbalized understanding of instructions that were given at this phone interview. Patient denies shortness of breath, chest pain, fever, cough a this phone interview.  Called with spanish interpreter number [phone], patietn speaks english well and patient stated no spanish interpreter needed for day of surgery 10-18-2019.

## 2019-10-15 ENCOUNTER — Other Ambulatory Visit (HOSPITAL_COMMUNITY)
Admission: RE | Admit: 2019-10-15 | Discharge: 2019-10-15 | Disposition: A | Discharge status: Home / Self Care | Payer: Self-pay | Source: Ambulatory Visit | Attending: Urology | Admitting: Urology

## 2019-10-15 DIAGNOSIS — Z20822 Contact with and (suspected) exposure to covid-19: Secondary | ICD-10-CM | POA: Insufficient documentation

## 2019-10-15 DIAGNOSIS — Z01812 Encounter for preprocedural laboratory examination: Secondary | ICD-10-CM | POA: Insufficient documentation

## 2019-10-15 LAB — SARS CORONAVIRUS 2 (TAT 6-24 HRS): SARS Coronavirus 2: NEGATIVE

## 2019-10-18 ENCOUNTER — Encounter (HOSPITAL_BASED_OUTPATIENT_CLINIC_OR_DEPARTMENT_OTHER)
Admission: RE | Disposition: A | Discharge status: Home / Self Care | Payer: Self-pay | Source: Home / Self Care | Attending: Urology

## 2019-10-18 ENCOUNTER — Ambulatory Visit (HOSPITAL_BASED_OUTPATIENT_CLINIC_OR_DEPARTMENT_OTHER): Payer: Self-pay | Admitting: Anesthesiology

## 2019-10-18 ENCOUNTER — Other Ambulatory Visit: Payer: Self-pay

## 2019-10-18 ENCOUNTER — Encounter (HOSPITAL_BASED_OUTPATIENT_CLINIC_OR_DEPARTMENT_OTHER): Payer: Self-pay | Admitting: Urology

## 2019-10-18 ENCOUNTER — Ambulatory Visit (HOSPITAL_BASED_OUTPATIENT_CLINIC_OR_DEPARTMENT_OTHER)
Admission: RE | Admit: 2019-10-18 | Discharge: 2019-10-18 | Disposition: A | Discharge status: Home / Self Care | Payer: Self-pay | Attending: Urology | Admitting: Urology

## 2019-10-18 DIAGNOSIS — N210 Calculus in bladder: Secondary | ICD-10-CM | POA: Insufficient documentation

## 2019-10-18 DIAGNOSIS — N138 Other obstructive and reflux uropathy: Secondary | ICD-10-CM | POA: Insufficient documentation

## 2019-10-18 DIAGNOSIS — N401 Enlarged prostate with lower urinary tract symptoms: Secondary | ICD-10-CM | POA: Insufficient documentation

## 2019-10-18 DIAGNOSIS — N200 Calculus of kidney: Secondary | ICD-10-CM | POA: Insufficient documentation

## 2019-10-18 DIAGNOSIS — F17210 Nicotine dependence, cigarettes, uncomplicated: Secondary | ICD-10-CM | POA: Insufficient documentation

## 2019-10-18 DIAGNOSIS — N35911 Unspecified urethral stricture, male, meatal: Secondary | ICD-10-CM | POA: Insufficient documentation

## 2019-10-18 DIAGNOSIS — I10 Essential (primary) hypertension: Secondary | ICD-10-CM | POA: Insufficient documentation

## 2019-10-18 DIAGNOSIS — R300 Dysuria: Secondary | ICD-10-CM

## 2019-10-18 DIAGNOSIS — Z87442 Personal history of urinary calculi: Secondary | ICD-10-CM | POA: Insufficient documentation

## 2019-10-18 HISTORY — PX: HOLMIUM LASER APPLICATION: SHX5852

## 2019-10-18 HISTORY — PX: CYSTOSCOPY WITH LITHOLAPAXY: SHX1425

## 2019-10-18 HISTORY — PX: URETEROSCOPY WITH HOLMIUM LASER LITHOTRIPSY: SHX6645

## 2019-10-18 SURGERY — CYSTOSCOPY, WITH BLADDER CALCULUS LITHOLAPAXY
Anesthesia: General | Site: Renal

## 2019-10-18 MED ORDER — FENTANYL CITRATE (PF) 100 MCG/2ML IJ SOLN
INTRAMUSCULAR | Status: AC
  Filled 2019-10-18: qty 2

## 2019-10-18 MED ORDER — LACTATED RINGERS IV SOLN: INTRAVENOUS | Status: DC

## 2019-10-18 MED ORDER — KETOROLAC TROMETHAMINE 30 MG/ML IJ SOLN
INTRAMUSCULAR | Status: AC
  Filled 2019-10-18: qty 1

## 2019-10-18 MED ORDER — MIDAZOLAM HCL 5 MG/5ML IJ SOLN
INTRAMUSCULAR | Status: DC | PRN
  Administered 2019-10-18: 2 mg via INTRAVENOUS

## 2019-10-18 MED ORDER — ONDANSETRON HCL 4 MG/2ML IJ SOLN
INTRAMUSCULAR | Status: DC | PRN
  Administered 2019-10-18: 4 mg via INTRAVENOUS

## 2019-10-18 MED ORDER — PROPOFOL 10 MG/ML IV BOLUS
INTRAVENOUS | Status: AC
  Filled 2019-10-18: qty 20

## 2019-10-18 MED ORDER — FENTANYL CITRATE (PF) 100 MCG/2ML IJ SOLN
25.0000 ug | INTRAMUSCULAR | Status: DC | PRN
  Administered 2019-10-18: 50 ug via INTRAVENOUS

## 2019-10-18 MED ORDER — DEXAMETHASONE SODIUM PHOSPHATE 10 MG/ML IJ SOLN
INTRAMUSCULAR | Status: DC | PRN
  Administered 2019-10-18: 10 mg via INTRAVENOUS

## 2019-10-18 MED ORDER — BELLADONNA ALKALOIDS-OPIUM 16.2-60 MG RE SUPP
RECTAL | Status: DC | PRN
  Administered 2019-10-18: 1 via RECTAL

## 2019-10-18 MED ORDER — CEPHALEXIN 500 MG PO CAPS
500.0000 mg | ORAL_CAPSULE | Freq: Two times a day (BID) | ORAL | 0 refills | Status: AC
Start: 2019-10-18 — End: 2019-10-21

## 2019-10-18 MED ORDER — OXYCODONE HCL 5 MG PO TABS
ORAL_TABLET | ORAL | Status: AC
  Filled 2019-10-18: qty 1

## 2019-10-18 MED ORDER — ONDANSETRON HCL 4 MG/2ML IJ SOLN
INTRAMUSCULAR | Status: AC
  Filled 2019-10-18: qty 2

## 2019-10-18 MED ORDER — FENTANYL CITRATE (PF) 100 MCG/2ML IJ SOLN
INTRAMUSCULAR | Status: DC | PRN
  Administered 2019-10-18: 50 ug via INTRAVENOUS
  Administered 2019-10-18: 25 ug via INTRAVENOUS
  Administered 2019-10-18: 50 ug via INTRAVENOUS
  Administered 2019-10-18: 25 ug via INTRAVENOUS
  Administered 2019-10-18 (×3): 50 ug via INTRAVENOUS

## 2019-10-18 MED ORDER — LIDOCAINE 2% (20 MG/ML) 5 ML SYRINGE
INTRAMUSCULAR | Status: DC | PRN
  Administered 2019-10-18: 100 mg via INTRAVENOUS

## 2019-10-18 MED ORDER — ACETAMINOPHEN 500 MG PO TABS
1000.0000 mg | ORAL_TABLET | Freq: Once | ORAL | Status: AC
  Administered 2019-10-18: 1000 mg via ORAL

## 2019-10-18 MED ORDER — OXYBUTYNIN CHLORIDE 5 MG PO TABS: 5.0000 mg | ORAL_TABLET | Freq: Three times a day (TID) | ORAL | 1 refills | Status: DC | PRN

## 2019-10-18 MED ORDER — DEXAMETHASONE SODIUM PHOSPHATE 10 MG/ML IJ SOLN
INTRAMUSCULAR | Status: AC
  Filled 2019-10-18: qty 1

## 2019-10-18 MED ORDER — OXYCODONE HCL 5 MG PO TABS
5.0000 mg | ORAL_TABLET | Freq: Once | ORAL | Status: AC | PRN
  Administered 2019-10-18: 5 mg via ORAL

## 2019-10-18 MED ORDER — MIDAZOLAM HCL 2 MG/2ML IJ SOLN
INTRAMUSCULAR | Status: AC
  Filled 2019-10-18: qty 2

## 2019-10-18 MED ORDER — ACETAMINOPHEN 500 MG PO TABS
ORAL_TABLET | ORAL | Status: AC
  Filled 2019-10-18: qty 2

## 2019-10-18 MED ORDER — PHENAZOPYRIDINE HCL 200 MG PO TABS
200.0000 mg | ORAL_TABLET | Freq: Three times a day (TID) | ORAL | 0 refills | Status: AC | PRN
Start: 2019-10-18 — End: 2020-10-17

## 2019-10-18 MED ORDER — SODIUM CHLORIDE 0.9 % IR SOLN
Status: DC | PRN
  Administered 2019-10-18 (×8): 3000 mL
  Administered 2019-10-18: 6000 mL
  Administered 2019-10-18: 3000 mL

## 2019-10-18 MED ORDER — CEFAZOLIN SODIUM-DEXTROSE 2-4 GM/100ML-% IV SOLN
2.0000 g | Freq: Once | INTRAVENOUS | Status: AC
  Administered 2019-10-18: 2 g via INTRAVENOUS

## 2019-10-18 MED ORDER — PROPOFOL 10 MG/ML IV BOLUS
INTRAVENOUS | Status: DC | PRN
  Administered 2019-10-18: 50 mg via INTRAVENOUS
  Administered 2019-10-18: 40 mg via INTRAVENOUS
  Administered 2019-10-18: 160 mg via INTRAVENOUS

## 2019-10-18 MED ORDER — ONDANSETRON HCL 4 MG PO TABS: 4.0000 mg | ORAL_TABLET | Freq: Every day | ORAL | 1 refills | Status: AC | PRN

## 2019-10-18 MED ORDER — HYDROCODONE-ACETAMINOPHEN 5-325 MG PO TABS: 2.0000 | ORAL_TABLET | ORAL | 0 refills | Status: DC | PRN

## 2019-10-18 MED ORDER — CEFAZOLIN SODIUM-DEXTROSE 2-4 GM/100ML-% IV SOLN
INTRAVENOUS | Status: AC
  Filled 2019-10-18: qty 100

## 2019-10-18 MED ORDER — LIDOCAINE 2% (20 MG/ML) 5 ML SYRINGE
INTRAMUSCULAR | Status: AC
  Filled 2019-10-18: qty 5

## 2019-10-18 MED ORDER — PROMETHAZINE HCL 25 MG/ML IJ SOLN: 6.2500 mg | INTRAMUSCULAR | Status: DC | PRN

## 2019-10-18 MED ORDER — BELLADONNA ALKALOIDS-OPIUM 16.2-60 MG RE SUPP
RECTAL | Status: AC
  Filled 2019-10-18: qty 1

## 2019-10-18 MED ORDER — OXYCODONE HCL 5 MG/5ML PO SOLN: 5.0000 mg | Freq: Once | ORAL | Status: AC | PRN

## 2019-10-18 SURGICAL SUPPLY — 38 items
APL SKNCLS STERI-STRIP NONHPOA (GAUZE/BANDAGES/DRESSINGS)
BAG DRAIN URO-CYSTO SKYTR STRL (DRAIN) ×4 IMPLANT
BAG DRN UROCATH (DRAIN) ×1
BALLN NEPHROSTOMY (BALLOONS) ×4
BALLOON NEPHROSTOMY (BALLOONS) ×3 IMPLANT
BASKET STONE 1.7 NGAGE (UROLOGICAL SUPPLIES) IMPLANT
BASKET ZERO TIP NITINOL 2.4FR (BASKET) IMPLANT
BENZOIN TINCTURE PRP APPL 2/3 (GAUZE/BANDAGES/DRESSINGS) IMPLANT
BLADE SURG 15 STRL LF DISP TIS (BLADE) ×3 IMPLANT
BLADE SURG 15 STRL SS (BLADE) ×4
CATH FOLEY 3WAY 30CC 22FR (CATHETERS) ×4 IMPLANT
CATH URET 5FR 28IN OPEN ENDED (CATHETERS) IMPLANT
CLOTH BEACON ORANGE TIMEOUT ST (SAFETY) ×4 IMPLANT
FIBER LASER FLEXIVA 1000 (UROLOGICAL SUPPLIES) ×4 IMPLANT
FIBER LASER FLEXIVA 365 (UROLOGICAL SUPPLIES) IMPLANT
FIBER LASER FLEXIVA 550 (UROLOGICAL SUPPLIES) IMPLANT
FIBER LASER TRAC TIP (UROLOGICAL SUPPLIES) ×4 IMPLANT
GLOVE BIO SURGEON STRL SZ 6.5 (GLOVE) ×8 IMPLANT
GLOVE BIO SURGEON STRL SZ7.5 (GLOVE) ×4 IMPLANT
GLOVE BIOGEL PI IND STRL 7.0 (GLOVE) ×9 IMPLANT
GLOVE BIOGEL PI INDICATOR 7.0 (GLOVE) ×3
GOWN STRL REUS W/TWL LRG LVL3 (GOWN DISPOSABLE) ×8 IMPLANT
GOWN STRL REUS W/TWL XL LVL3 (GOWN DISPOSABLE) ×8 IMPLANT
GUIDEWIRE STR DUAL SENSOR (WIRE) IMPLANT
GUIDEWIRE ZIPWRE .038 STRAIGHT (WIRE) ×4 IMPLANT
IV NS IRRIG 3000ML ARTHROMATIC (IV SOLUTION) ×52 IMPLANT
KIT TURNOVER CYSTO (KITS) ×4 IMPLANT
LOOP CUT BIPOLAR 24F LRG (ELECTROSURGICAL) ×4 IMPLANT
MANIFOLD NEPTUNE II (INSTRUMENTS) ×4 IMPLANT
NS IRRIG 500ML POUR BTL (IV SOLUTION) ×4 IMPLANT
STENT URET 6FRX24 CONTOUR (STENTS) ×8 IMPLANT
STRIP CLOSURE SKIN 1/2X4 (GAUZE/BANDAGES/DRESSINGS) IMPLANT
SYR 10ML LL (SYRINGE) ×4 IMPLANT
SYR TOOMEY IRRIG 70ML (MISCELLANEOUS)
SYRINGE TOOMEY IRRIG 70ML (MISCELLANEOUS) IMPLANT
TRAY CYSTO PACK (CUSTOM PROCEDURE TRAY) ×4 IMPLANT
TUBE CONNECTING 12X1/4 (SUCTIONS) ×4 IMPLANT
TUBING UROLOGY SET (TUBING) ×4 IMPLANT

## 2019-10-18 NOTE — Discharge Instructions (Signed)

## 2019-10-18 NOTE — Transfer of Care (Signed)
Immediate Anesthesia Transfer of Care Note  Patient: Jackson Sullivan  Procedure(s) Performed: CYSTOSCOPY WITH LITHOLAPAXY, TRANS INCISIONAL EXCISION OF PROSTATE, BLADDER FULGERATION (N/A Bladder) URETEROSCOPY WITH HOLMIUM LASER LITHOTRIPSY/ STENT PLACEMENT (Bilateral Renal) HOLMIUM LASER APPLICATION (Renal)  Patient Location: PACU  Anesthesia Type:General  Level of Consciousness: awake, alert  and oriented  Airway & Oxygen Therapy: Patient Spontanous Breathing and Patient connected to nasal cannula oxygen  Post-op Assessment: Report given to RN  Post vital signs: Reviewed and stable  Last Vitals:  Vitals Value Taken Time  BP 148/91 10/18/19 1606  Temp    Pulse 89 10/18/19 1607  Resp 11 10/18/19 1607  SpO2 98 % 10/18/19 1607  Vitals shown include unvalidated device data.  Last Pain:  Vitals:   10/18/19 0933  TempSrc: Oral         Complications: No apparent anesthesia complications

## 2019-10-18 NOTE — H&P (Signed)
Urology Preoperative H&P   Chief Complaint: Bladder stones  History of Present Illness: Jackson Sullivan is a 59 y.o. male with a history of BPH with lower Neri tract symptoms and kidney stones. He was recently seen in the office for increasing lower urinary tract symptoms and dysuria. KUB performed in the office revealed numerous bladder stones as well as bilateral nonobstructing renal stones. He was started on tamsulosin once daily at that time and reports only modest improvement in his lower urinary tract symptoms. He denies interval UTIs, dysuria or gross hematuria. He is here today for definitive treatment of his numerous bladder stones and bilateral nonobstructing renal stones.  Past Medical History:  Diagnosis Date  . Bladder stones   . Dysuria   . ED (erectile dysfunction)   . Frequency of urination   . Hematuria   . History of hypertension    PER PT 2015  NO LONGER AN ISSUE  . History of kidney stones   . Nephrolithiasis    BILATERAL   . Rash    LEGS     Past Surgical History:  Procedure Laterality Date  . COLONOSCOPY N/A 05/17/2013   Procedure: COLONOSCOPY;  Surgeon: Beryle Beams, MD;  Location: [facility];  Service: Endoscopy;  Laterality: N/A;  . CYSTOSCOPY W/ RETROGRADES Bilateral 01/26/2016   Procedure: CYSTOSCOPY WITH RETROGRADE PYELOGRAM;  Surgeon: Festus Aloe, MD;  [facility]: [facility];  Service: Urology;  Laterality: Bilateral;  . CYSTOSCOPY WITH BIOPSY  01/26/2016   Procedure: CYSTOSCOPY WITH  URETHRAL BIOPSY;  Surgeon: Festus Aloe, MD;  Location: [facility];  Service: Urology;;  . Consuela Mimes WITH LITHOLAPAXY N/A 01/26/2016   Procedure: CYSTOSCOPY WITH LITHOLAPAXY;  Surgeon: Festus Aloe, MD;  Location: [facility];  Service: Urology;  Laterality: N/A;  . CYSTOSCOPY WITH STENT PLACEMENT Left 01/26/2016   Procedure: CYSTOSCOPY WITH STENT PLACEMENT;  Surgeon: Festus Aloe, MD;  Location: [facility];  Service: Urology;  Laterality: Left;  . EXTRACORPOREAL SHOCK WAVE LITHOTRIPSY Left 12-14-2015;   03-07-2011  . HOLMIUM LASER APPLICATION N/A [id number]   Procedure: HOLMIUM LASER APPLICATION;  Surgeon: Festus Aloe, MD;  Location: [facility];  Service: Urology;  Laterality: N/A;  . [name]    in [place]  . STONE EXTRACTION WITH BASKET Left 01/26/2016   Procedure: STONE EXTRACTION WITH BASKET;  Surgeon: Festus Aloe, MD;  Location: [facility];  Service: Urology;  Laterality: Left;  . URETEROSCOPY Left 01/26/2016   Procedure: CYSTOSCOPY WITH  URETEROSCOPY;  Surgeon: Festus Aloe, MD;  Location: [facility];  Service: Urology;  Laterality: Left;    Allergies: No Known Allergies  History reviewed. No pertinent family history.  Social History:  reports that he has been smoking cigarettes. He has a 6.25 pack-year smoking history. He has never used smokeless tobacco. He reports that he does not drink alcohol or use drugs.  ROS: A complete review of systems was performed.  All systems are negative except for pertinent findings as noted.  Physical Exam:  Vital signs in last 24 hours: Temp:  [97.8 F (36.6 C)] 97.8 F (36.6 C) (05/14 0933) Pulse Rate:  [70] 70 (05/14 0933) Resp:  [16] 16 (05/14 0933) BP: (144)/(98) 144/98 (05/14 0933) SpO2:  [100 %] 100 % (05/14 0933) Weight:  [79.5 kg] 79.5 kg (05/14 0933) Constitutional:  Alert and oriented, No acute distress Cardiovascular: Regular rate and rhythm, No JVD Respiratory: Normal respiratory effort, Lungs clear bilaterally  GI: Abdomen is soft, nontender, nondistended, no abdominal masses GU: No CVA tenderness Lymphatic: No lymphadenopathy Neurologic: Grossly intact, no focal deficits Psychiatric: Normal mood and affect  Laboratory Data:  No results for input(s): WBC, HGB, HCT, PLT in the last 72 hours.  No results for input(s): NA, K, CL, GLUCOSE, BUN,  CALCIUM, CREATININE in the last 72 hours.  Invalid input(s): CO3   No results found for this or any previous visit (from the past 24 hour(s)). Recent Results (from the past 240 hour(s))  SARS CORONAVIRUS 2 (TAT 6-24 HRS) Nasopharyngeal Nasopharyngeal Swab     Status: None   Collection Time: 10/15/19  8:43 AM   Specimen: Nasopharyngeal Swab  Result Value Ref Range Status   SARS Coronavirus 2 NEGATIVE NEGATIVE Final    Comment: (NOTE) SARS-CoV-2 target nucleic acids are NOT DETECTED. The SARS-CoV-2 RNA is generally detectable in upper and lower respiratory specimens during the acute phase of infection. Negative results do not preclude SARS-CoV-2 infection, do not rule out co-infections with other pathogens, and should not be used as the sole basis for treatment or other patient management decisions. Negative results must be combined with clinical observations, patient history, and epidemiological information. The expected result is Negative. Fact Sheet for Patients: SugarRoll.be Fact Sheet for Healthcare Providers: https://www.woods-mathews.com/ This test is not yet approved or cleared by the [place] FDA and  has been authorized for detection and/or diagnosis of SARS-CoV-2 by FDA under an Emergency Use Authorization (EUA). This EUA will remain  in effect (meaning this test can be used) for the duration of the COVID-19 declaration under Section 56 4(b)(1) of the Act, 21 U.S.C. section 360bbb-3(b)(1), unless the authorization is terminated or revoked sooner. Performed at [facility], [place] [address], [place], [place] [address]     Renal Function: No results for input(s): CREATININE in the last 168 hours. CrCl cannot be calculated (Patient's most recent lab result is older than the maximum 21 days allowed.).  Radiologic Imaging: No results found.  I independently reviewed the above imaging studies.  Assessment and  Plan Bryer FENTRESS CECILIO is a 59 y.o. male with BPH, bilateral nonobstructing renal stones and numerous large bladder stones  The risk, benefits and alternatives of cystolitholapaxy, bilateral ureteroscopy, laser lithotripsy, bilateral JJ stent placement and possible TURP was discussed with the patient. Risks include, but are not limited to bleeding, urinary tract infection, bladder perforation, ureteral orifice injury, ureteral injury, ureteral stricture disease, urethral stricture disease, the need for an indwelling Foley catheter, MI, CVA, PE, DVT and the inherent risk of general anesthesia. He voices understanding and wishes to proceed.  Ellison Hughs, MD 10/18/2019, 1:43 PM  [facility] Pager: [phone]

## 2019-10-18 NOTE — Op Note (Signed)
Operative Note  Preoperative diagnosis:  1.  BPH with lower urinary tract symptoms 2.  Multiple bladder stones 3.  Bilateral renal stones measuring approximately 5 mm  Postoperative diagnosis: 1.  BPH with lower urinary tract symptoms 2.  Multiple bladder stones 3.  Bilateral renal stones measuring approximately 5 mm 4.  Meatal stenosis  Procedure(s): 1.  Cystoscopy with cystolitholapaxy 2.  Bilateral ureteroscopy with bilateral holmium laser lithotripsy and bilateral JJ stent placement 3.  Transurethral incision of the prostate 4.  Bladder fulguration 5.  Urethral dilation  Surgeon: Jackson Hughs, MD  Assistants:  None  Anesthesia:  General  Complications:  None  EBL: 150 mL  Specimens: 1.  Bladder stones 2.  Prostate chips  Drains/Catheters: 1.  22 French three-way Foley catheter with 20 mL in the balloon 2.  Bilateral 6 French, 24 cm JJ stents without tethers  Intraoperative findings:   1. Trilobar prostatic urethral obstruction due to the size of the prostate, the ureteral orifices were obscured from view and a transurethral incision had to be performed to get access to the bilateral ureteral orifices 2. Multiple 2 cm bladder stones 3. Multiple 5 mm bilateral renal stones  Indication:  Jackson Sullivan is a 59 y.o. male with a history of BPH with LUTS, bladder stones and bilateral renal stones.  He has been consented for the above procedures, voices understanding and wishes to proceed.  Description of procedure:  After informed consent was obtained, the patient was brought to the operating room and general LMA anesthesia was administered. The patient was then placed in the dorsolithotomy position and prepped and draped in the usual sterile fashion. A timeout was performed. A 23 French rigid cystoscope was then inserted into the urethral meatus and advanced into the bladder under direct vision. A complete bladder survey revealed multiple bladder stones and  trilobar prostatic urethral obstruction.  The ureteral orifices could not be identified due to the size of the prostate.  The 23 French cystoscope then removed.  I attempted to place the 46 French continuous-flow sheath, but was unable to due to meatal stenosis.  Jackson Sullivan sounds were then used to dilate the urethral meatus starting at 20 [place] and progressing up to 28 [place], and 2 [place] increments.  The 55 French resectoscope with continuous flow sheath was then inserted into the bladder.  The bladder stones were then fragmented using a 1000 W holmium laser.  The bladder stone fragments were then siphoned out of the bladder through the resectoscope sheath.  A bipolar loop working element was then used to incise the prostate from the bladder neck and prostatic urethra down to the verumontanum, allowing identification of the ureteral orifice ease, bilaterally.  A Glidewire was then advanced up the left ureter to the left renal pelvis, or fluoroscopic guidance.  A flexible ureteroscope was then advanced over the wire and into position within the left renal pelvis, confirming placement via fluoroscopy.  A 200 m holmium laser was then used to dust his multiple 5 mm left renal stones.  The flexible ureteroscope was then removed under direct vision, leaving the Glidewire in place.  A 6 French, 24 cm JJ stent was then placed over the wire and into good position within the left collecting system, confirming placement via fluoroscopy.  A Glidewire was then advanced up the right ureteral orifice and up to the right renal pelvis, under fluoroscopic guidance.  A flexible ureteroscope was then advanced over the wire and into position within the  right renal pelvis, confirming placement via fluoroscopy.  A 200 m holmium laser was then used to dust his multiple 5 mm right renal stones.  The flexible ureteroscope was then removed under direct vision, leaving the Glidewire in place.  A 6 French, 24 cm JJ stent was then  placed within the right collecting system, confirming placement via fluoroscopy.  The 10 French resectoscope was then reinserted into the urethra and extensive fulguration of the prostatic urethra as well as multiple areas within the bladder was performed with bipolar electrocautery until hemostasis was achieved.  A 22 French three-way Foley catheter was then placed and extensively hand irrigated until free of clot.  The Foley catheter was then placed to continuous bladder irrigation.  The patient was then awoken from anesthesia having tolerated the procedure well.  He was transferred to the postanesthesia in stable condition.  Plan: Follow-up in 1 week for Foley catheter and bilateral stent removal

## 2019-10-18 NOTE — Anesthesia Procedure Notes (Addendum)
Procedure Name: LMA Insertion Date/Time: 10/18/2019 2:06 PM Performed by: Bonney Aid, CRNA Pre-anesthesia Checklist: Patient identified, Emergency Drugs available, Suction available and Patient being monitored Patient Re-evaluated:Patient Re-evaluated prior to induction Oxygen Delivery Method: Circle system utilized Preoxygenation: Pre-oxygenation with 100% oxygen Induction Type: IV induction Ventilation: Mask ventilation without difficulty LMA: LMA inserted LMA Size: 5.0 Number of attempts: 1 Airway Equipment and Method: Bite block Placement Confirmation: positive ETCO2 Tube secured with: Tape Dental Injury: Teeth and Oropharynx as per pre-operative assessment  Comments: R upper front tooth very loose.  LMA placed carefully with bite block inplace.  No contact with tooth.

## 2019-10-18 NOTE — Anesthesia Preprocedure Evaluation (Addendum)
Anesthesia Evaluation  Patient identified by MRN, date of birth, ID band Patient awake    Reviewed: Allergy & Precautions, NPO status , Patient's Chart, lab work & pertinent test results  History of Anesthesia Complications Negative for: history of anesthetic complications  Airway Mallampati: II  TM Distance: >3 FB Neck ROM: Full    Dental  (+) Loose, Missing,    Pulmonary Current Smoker and Patient abstained from smoking.,    Pulmonary exam normal        Cardiovascular negative cardio ROS Normal cardiovascular exam     Neuro/Psych negative neurological ROS  negative psych ROS   GI/Hepatic negative GI ROS, Neg liver ROS,   Endo/Other  negative endocrine ROS  Renal/GU Renal stones  negative genitourinary   Musculoskeletal negative musculoskeletal ROS (+)   Abdominal   Peds  Hematology negative hematology ROS (+)   Anesthesia Other Findings Day of surgery medications reviewed with patient.  Reproductive/Obstetrics negative OB ROS                            Anesthesia Physical Anesthesia Plan  ASA: II  Anesthesia Plan: General   Post-op Pain Management:    Induction: Intravenous  PONV Risk Score and Plan: 2 and Treatment may vary due to age or medical condition, Ondansetron, Dexamethasone and Midazolam  Airway Management Planned: LMA  Additional Equipment: None  Intra-op Plan:   Post-operative Plan: Extubation in OR  Informed Consent: I have reviewed the patients History and Physical, chart, labs and discussed the procedure including the risks, benefits and alternatives for the proposed anesthesia with the patient or authorized representative who has indicated his/her understanding and acceptance.     Dental advisory given  Plan Discussed with: CRNA  Anesthesia Plan Comments:        Anesthesia Quick Evaluation

## 2019-10-21 LAB — SURGICAL PATHOLOGY

## 2019-10-21 NOTE — Anesthesia Postprocedure Evaluation (Signed)
Anesthesia Post Note  Patient: Jackson Sullivan  Procedure(s) Performed: CYSTOSCOPY WITH LITHOLAPAXY, TRANS INCISIONAL EXCISION OF PROSTATE, BLADDER FULGERATION (N/A Bladder) URETEROSCOPY WITH HOLMIUM LASER LITHOTRIPSY/ STENT PLACEMENT (Bilateral Renal) HOLMIUM LASER APPLICATION (Renal)     Patient location during evaluation: PACU Anesthesia Type: General Level of consciousness: awake and alert and oriented Pain management: pain level controlled Vital Signs Assessment: post-procedure vital signs reviewed and stable Respiratory status: spontaneous breathing, nonlabored ventilation and respiratory function stable Cardiovascular status: blood pressure returned to baseline Postop Assessment: no apparent nausea or vomiting Anesthetic complications: no    Last Vitals:  Vitals:   10/18/19 1700 10/18/19 1754  BP: (!) 152/93 (!) 132/98  Pulse: 85 89  Resp: 13 16  Temp:  36.8 C  SpO2: 100% 96%    Last Pain:  Vitals:   10/18/19 1754  TempSrc:   PainSc: 7    Pain Goal:                   Brennan Bailey

## 2019-10-24 ENCOUNTER — Other Ambulatory Visit: Payer: Self-pay

## 2019-10-24 ENCOUNTER — Emergency Department (HOSPITAL_COMMUNITY)
Admission: EM | Admit: 2019-10-24 | Discharge: 2019-10-25 | Disposition: A | Discharge status: Home / Self Care | Payer: Self-pay | Attending: Emergency Medicine | Admitting: Emergency Medicine

## 2019-10-24 ENCOUNTER — Encounter (HOSPITAL_COMMUNITY): Payer: Self-pay

## 2019-10-24 DIAGNOSIS — T839XXA Unspecified complication of genitourinary prosthetic device, implant and graft, initial encounter: Secondary | ICD-10-CM | POA: Insufficient documentation

## 2019-10-24 DIAGNOSIS — F17210 Nicotine dependence, cigarettes, uncomplicated: Secondary | ICD-10-CM | POA: Insufficient documentation

## 2019-10-24 DIAGNOSIS — I10 Essential (primary) hypertension: Secondary | ICD-10-CM | POA: Insufficient documentation

## 2019-10-24 DIAGNOSIS — Y732 Prosthetic and other implants, materials and accessory gastroenterology and urology devices associated with adverse incidents: Secondary | ICD-10-CM | POA: Insufficient documentation

## 2019-10-24 DIAGNOSIS — Z79899 Other long term (current) drug therapy: Secondary | ICD-10-CM | POA: Insufficient documentation

## 2019-10-24 NOTE — ED Triage Notes (Signed)
Pt reports that his urine is leaking around his foley. States that the foley was placed. Last week and is due to be removed tomorrow. No pain or bleeding.

## 2019-10-25 NOTE — ED Provider Notes (Signed)
[facility] DEPT Provider Note   CSN: [id number] Arrival date & time: 10/24/19  1938     History Chief Complaint  Patient presents with  . Foley Leaking    Jackson Sullivan is a 59 y.o. male.  HPI Patient presents Foley catheter.  Had Foley catheter placed last week at the surgery.  Due to have his Foley removed in 8 hours after I saw him.  States he is still had urine in his bag but it was leaking around the catheter under the diaper he was wearing.  No abdominal pain.  No fevers.  Ask if the catheter may remove now and he can follow-up in the morning.    Past Medical History:  Diagnosis Date  . Bladder stones   . Dysuria   . ED (erectile dysfunction)   . Frequency of urination   . Hematuria   . History of hypertension    PER PT 2015  NO LONGER AN ISSUE  . History of kidney stones   . Nephrolithiasis    BILATERAL   . Rash    LEGS     Patient Active Problem List   Diagnosis Date Noted  . Kidney stones 11/16/2015    Past Surgical History:  Procedure Laterality Date  . COLONOSCOPY N/A 05/17/2013   Procedure: COLONOSCOPY;  Surgeon: Beryle Beams, MD;  Location: [facility];  Service: Endoscopy;  Laterality: N/A;  . CYSTOSCOPY W/ RETROGRADES Bilateral 01/26/2016   Procedure: CYSTOSCOPY WITH RETROGRADE PYELOGRAM;  Surgeon: Festus Aloe, MD;  Location: [facility];  Service: Urology;  Laterality: Bilateral;  . CYSTOSCOPY WITH BIOPSY  01/26/2016   Procedure: CYSTOSCOPY WITH  URETHRAL BIOPSY;  Surgeon: Festus Aloe, MD;  Location: [facility];  Service: Urology;;  . Consuela Mimes WITH LITHOLAPAXY N/A 01/26/2016   Procedure: CYSTOSCOPY WITH LITHOLAPAXY;  Surgeon: Festus Aloe, MD;  Location: [facility];  Service: Urology;  Laterality: N/A;  . CYSTOSCOPY WITH LITHOLAPAXY N/A 10/18/2019   Procedure: CYSTOSCOPY WITH LITHOLAPAXY, TRANS INCISIONAL EXCISION OF PROSTATE, BLADDER FULGERATION;   Surgeon: Ceasar Mons, MD;  Location: [facility];  Service: Urology;  Laterality: N/A;  . CYSTOSCOPY WITH STENT PLACEMENT Left 01/26/2016   Procedure: CYSTOSCOPY WITH STENT PLACEMENT;  Surgeon: Festus Aloe, MD;  Location: [facility];  Service: Urology;  Laterality: Left;  . EXTRACORPOREAL SHOCK WAVE LITHOTRIPSY Left 12-14-2015;   03-07-2011  . HOLMIUM LASER APPLICATION N/A [id number]   Procedure: HOLMIUM LASER APPLICATION;  Surgeon: Festus Aloe, MD;  Location: [facility];  Service: Urology;  Laterality: N/A;  . HOLMIUM LASER APPLICATION  [id number]   Procedure: HOLMIUM LASER APPLICATION;  Surgeon: Ceasar Mons, MD;  Location: [facility];  Service: Urology;;  . [place]    in [place]  . STONE EXTRACTION WITH BASKET Left 01/26/2016   Procedure: STONE EXTRACTION WITH BASKET;  Surgeon: Festus Aloe, MD;  Location: [facility];  Service: Urology;  Laterality: Left;  . URETEROSCOPY Left 01/26/2016   Procedure: CYSTOSCOPY WITH  URETEROSCOPY;  Surgeon: Festus Aloe, MD;  Location: [facility];  Service: Urology;  Laterality: Left;  . URETEROSCOPY WITH HOLMIUM LASER LITHOTRIPSY Bilateral 10/18/2019   Procedure: URETEROSCOPY WITH HOLMIUM LASER LITHOTRIPSY/ STENT PLACEMENT;  Surgeon: Ceasar Mons, MD;  Location: [facility];  Service: Urology;  Laterality: Bilateral;       History reviewed. No pertinent family history.  Social History  Tobacco Use  . Smoking status: Current Every Day Smoker    Packs/day: 0.25    Years: 25.00    Pack years: 6.25    Types: Cigarettes  . Smokeless tobacco: Never Used  . Tobacco comment: 3-4 CIG PER DAY  Substance Use Topics  . Alcohol use: No    Alcohol/week: 0.0 standard drinks  . Drug use: No    Home Medications Prior to Admission medications   Medication Sig Start Date End Date Taking?  Authorizing Provider  acetaminophen (TYLENOL) 500 MG tablet Take 500 mg by mouth every 6 (six) hours as needed.   Yes [provider]  HYDROcodone-acetaminophen (NORCO/VICODIN) 5-325 MG tablet Take 2 tablets by mouth every 4 (four) hours as needed. 10/18/19  Yes Ceasar Mons, MD  ondansetron [facility]) 4 MG tablet Take 1 tablet (4 mg total) by mouth daily as needed for nausea or vomiting. 10/18/19 10/17/20 Yes Ceasar Mons, MD  oxybutynin (DITROPAN) 5 MG tablet Take 1 tablet (5 mg total) by mouth every 8 (eight) hours as needed for bladder spasms. 10/18/19  Yes Ceasar Mons, MD  phenazopyridine (PYRIDIUM) 200 MG tablet Take 1 tablet (200 mg total) by mouth 3 (three) times daily as needed (for pain with urination). 10/18/19 10/17/20 Yes Ceasar Mons, MD  tamsulosin (FLOMAX) 0.4 MG CAPS capsule Take 1 capsule (0.4 mg total) by mouth daily. 09/26/19  Yes Faustino Congress, NP    Allergies    Patient has no known allergies.  Review of Systems   Review of Systems  Constitutional: Negative for appetite change.  HENT: Negative for congestion.   Cardiovascular: Negative for chest pain.  Gastrointestinal: Negative for abdominal pain.  Genitourinary: Negative for difficulty urinating.       Leaking around Foley.  Musculoskeletal: Negative for back pain.  Neurological: Negative for weakness.    Physical Exam Updated Vital Signs BP (!) 166/99 (BP Location: Left Arm)   Pulse 92   Temp 98.4 F (36.9 C) (Oral)   Resp 18   SpO2 98%   Physical Exam Vitals and nursing note reviewed.  Cardiovascular:     Rate and Rhythm: Normal rate and regular rhythm.  Abdominal:     Tenderness: There is no abdominal tenderness.  Genitourinary:    Comments: 62 French three-way Foley catheter in penis.  Some dark urine in diaper he had around this.  No active draining around the catheter and does have urine in the bag.  No tenderness on abdomen.  Musculoskeletal:        General: No tenderness.  Skin:    Capillary Refill: Capillary refill takes less than 2 seconds.  Neurological:     Mental Status: He is alert.     ED Results / Procedures / Treatments   Labs (all labs ordered are listed, but only abnormal results are displayed) Labs Reviewed - No data to display  EKG None  Radiology No results found.  Procedures Procedures (including critical care time)  Medications Ordered in ED Medications - No data to display  ED Course  I have reviewed the triage vital signs and the nursing notes.  Pertinent labs & imaging results that were available during my care of the patient were reviewed by me and considered in my medical decision making (see chart for details).    MDM Rules/Calculators/A&P                      Patient leaking around Foley catheter.  Due  to be seen by urology in under 8 hours.  Will remove catheter.  Reviewed notes from surgery and plan was to be removed tomorrow anyway.  Will discharge home and will follow up in the morning.  If becomes painful urinate and cannot urinate will return sooner Final Clinical Impression(s) / ED Diagnoses Final diagnoses:  Foley catheter problem, initial encounter [facility])    Rx / DC Orders ED Discharge Orders    None       Davonna Belling, MD 10/25/19 [phone]

## 2020-01-26 ENCOUNTER — Ambulatory Visit (HOSPITAL_COMMUNITY)
Admission: EM | Admit: 2020-01-26 | Discharge: 2020-01-26 | Disposition: A | Discharge status: Home / Self Care | Payer: Self-pay | Attending: Family Medicine | Admitting: Family Medicine

## 2020-01-26 ENCOUNTER — Encounter (HOSPITAL_COMMUNITY): Payer: Self-pay | Admitting: *Deleted

## 2020-01-26 ENCOUNTER — Other Ambulatory Visit: Payer: Self-pay

## 2020-01-26 DIAGNOSIS — R319 Hematuria, unspecified: Secondary | ICD-10-CM

## 2020-01-26 DIAGNOSIS — R109 Unspecified abdominal pain: Secondary | ICD-10-CM

## 2020-01-26 DIAGNOSIS — N23 Unspecified renal colic: Secondary | ICD-10-CM

## 2020-01-26 DIAGNOSIS — R300 Dysuria: Secondary | ICD-10-CM

## 2020-01-26 LAB — POCT URINALYSIS DIPSTICK, ED / UC
Bilirubin Urine: NEGATIVE
Glucose, UA: NEGATIVE mg/dL
Ketones, ur: NEGATIVE mg/dL
Leukocytes,Ua: NEGATIVE
Nitrite: NEGATIVE
Protein, ur: 100 mg/dL — AB
Specific Gravity, Urine: 1.025 (ref 1.005–1.030)
Urobilinogen, UA: 0.2 mg/dL (ref 0.0–1.0)
pH: 6 (ref 5.0–8.0)

## 2020-01-26 MED ORDER — HYDROCODONE-ACETAMINOPHEN 5-325 MG PO TABS: 1.0000 | ORAL_TABLET | Freq: Four times a day (QID) | ORAL | 0 refills | Status: DC | PRN

## 2020-01-26 NOTE — ED Triage Notes (Signed)
Pt reports surgery for kidney and bladder stones few months ago; pt reports passing stones 2-3x/wk since procedure.  C/O right flank pain and abd pains, but has run out of his pain meds and doesn't have f/u until Sept.

## 2020-01-26 NOTE — Discharge Instructions (Addendum)
Please make sure you follow up with your urologist for your recurrent kidney stone pain tomorrow morning.

## 2020-01-26 NOTE — ED Provider Notes (Signed)
[place]   MRN: [id number] DOB: [birth year]  Subjective:   Jackson Sullivan is a 59 y.o. male presenting for 2 to 3-week history of persistent intermittent right-sided flank pain, hematuria and belly aches.  Patient has longstanding history of multiple kidney stones.  He states that he saw if you pass about 2 or 3 weeks ago.  He last had a procedure this past May for lithotripsy.  He works closely with the urologist, would like to have a refill of his hydrocodone for his pain.  He is using Flomax daily.  No current facility-administered medications for this encounter.  Current Outpatient Medications:  .  HYDROcodone-acetaminophen (NORCO/VICODIN) 5-325 MG tablet, Take 2 tablets by mouth every 4 (four) hours as needed., Disp: 20 tablet, Rfl: 0 .  ondansetron (ZOFRAN) 4 MG tablet, Take 1 tablet (4 mg total) by mouth daily as needed for nausea or vomiting., Disp: 30 tablet, Rfl: 1 .  tamsulosin (FLOMAX) 0.4 MG CAPS capsule, Take 1 capsule (0.4 mg total) by mouth daily., Disp: 30 capsule, Rfl: 3 .  acetaminophen (TYLENOL) 500 MG tablet, Take 500 mg by mouth every 6 (six) hours as needed., Disp: , Rfl:  .  oxybutynin (DITROPAN) 5 MG tablet, Take 1 tablet (5 mg total) by mouth every 8 (eight) hours as needed for bladder spasms., Disp: 30 tablet, Rfl: 1 .  phenazopyridine (PYRIDIUM) 200 MG tablet, Take 1 tablet (200 mg total) by mouth 3 (three) times daily as needed (for pain with urination)., Disp: 30 tablet, Rfl: 0   No Known Allergies  Past Medical History:  Diagnosis Date  . Bladder stones   . Dysuria   . ED (erectile dysfunction)   . Frequency of urination   . Hematuria   . History of hypertension    PER PT 2015  NO LONGER AN ISSUE  . History of kidney stones   . Hypertension   . Nephrolithiasis    BILATERAL   . Rash    LEGS      Past Surgical History:  Procedure Laterality Date  . COLONOSCOPY N/A 05/17/2013   Procedure: COLONOSCOPY;  Surgeon: Beryle Beams, MD;   Location: [facility];  Service: Endoscopy;  Laterality: N/A;  . CYSTOSCOPY W/ RETROGRADES Bilateral 01/26/2016   Procedure: CYSTOSCOPY WITH RETROGRADE PYELOGRAM;  Surgeon: Festus Aloe, MD;  Location: [facility];  Service: Urology;  Laterality: Bilateral;  . CYSTOSCOPY WITH BIOPSY  01/26/2016   Procedure: CYSTOSCOPY WITH  URETHRAL BIOPSY;  Surgeon: Festus Aloe, MD;  Location: [facility];  Service: Urology;;  . Consuela Mimes WITH LITHOLAPAXY N/A 01/26/2016   Procedure: CYSTOSCOPY WITH LITHOLAPAXY;  Surgeon: Festus Aloe, MD;  Location: [facility];  Service: Urology;  Laterality: N/A;  . CYSTOSCOPY WITH LITHOLAPAXY N/A 10/18/2019   Procedure: CYSTOSCOPY WITH LITHOLAPAXY, TRANS INCISIONAL EXCISION OF PROSTATE, BLADDER FULGERATION;  Surgeon: Ceasar Mons, MD;  Location: [facility];  Service: Urology;  Laterality: N/A;  . CYSTOSCOPY WITH STENT PLACEMENT Left 01/26/2016   Procedure: CYSTOSCOPY WITH STENT PLACEMENT;  Surgeon: Festus Aloe, MD;  Location[facility];  Service: Urology;  Laterality: Left;  . EXTRACORPOREAL SHOCK WAVE LITHOTRIPSY Left 12-14-2015;   03-07-2011  . HOLMIUM LASER APPLICATION N/A 9/50/9326   Procedure: HOLMIUM LASER APPLICATION;  Surgeon: Festus Aloe, MD;  Location: [facility];  Service: Urology;  Laterality: N/A;  . HOLMIUM LASER APPLICATION  12/16/4578   Procedure: HOLMIUM LASER APPLICATION;  Surgeon: Ceasar Mons, MD;  Location: [facility];  Service: Urology;;  . [place]    in [place]  . STONE EXTRACTION WITH BASKET Left 01/26/2016   Procedure: STONE EXTRACTION WITH BASKET;  Surgeon: Festus Aloe, MD;  Location: [facility];  Service: Urology;  Laterality: Left;  . URETEROSCOPY Left 01/26/2016   Procedure: CYSTOSCOPY WITH  URETEROSCOPY;  Surgeon: Festus Aloe, MD;  Location: [facility];  Service: Urology;  Laterality: Left;  . URETEROSCOPY WITH HOLMIUM LASER LITHOTRIPSY Bilateral 10/18/2019   Procedure: URETEROSCOPY WITH HOLMIUM LASER LITHOTRIPSY/ STENT PLACEMENT;  Surgeon: Ceasar Mons, MD;  Location: [facility];  Service: Urology;  Laterality: Bilateral;    History reviewed. No pertinent family history.  Social History   Tobacco Use  . Smoking status: Current Every Day Smoker    Packs/day: 0.25    Years: 25.00    Pack years: 6.25    Types: Cigarettes  . Smokeless tobacco: Never Used  . Tobacco comment: 3-4 CIG PER DAY  Vaping Use  . Vaping Use: Never used  Substance Use Topics  . Alcohol use: No    Alcohol/week: 0.0 standard drinks    Comment: Quit 25 yrs ago  . Drug use: No    ROS   Objective:   Vitals: BP (!) 187/79   Pulse 84   Temp 98.3 F (36.8 C) (Oral)   Resp 16   SpO2 97%   Physical Exam Constitutional:      General: He is not in acute distress.    Appearance: Normal appearance. He is well-developed and normal weight. He is not ill-appearing, toxic-appearing or diaphoretic.  HENT:     Head: Normocephalic and atraumatic.     Right Ear: External ear normal.     Left Ear: External ear normal.     Nose: Nose normal.     Mouth/Throat:     Pharynx: Oropharynx is clear.  Eyes:     General: No scleral icterus.       Right eye: No discharge.        Left eye: No discharge.     Extraocular Movements: Extraocular movements intact.     Pupils: Pupils are equal, round, and reactive to light.  Cardiovascular:     Rate and Rhythm: Normal rate.  Pulmonary:     Effort: Pulmonary effort is normal.  Musculoskeletal:     Cervical back: Normal range of motion.  Neurological:     Mental Status: He is alert and oriented to person, place, and time.  Psychiatric:        Mood and Affect: Mood normal.        Behavior: Behavior normal.        Thought Content: Thought content normal.        Judgment: Judgment  normal.     Results for orders placed or performed during the hospital encounter of 01/26/20 (from the past 24 hour(s))  POC Urinalysis dipstick     Status: Abnormal   Collection Time: 01/26/20 10:27 AM  Result Value Ref Range   Glucose, UA NEGATIVE NEGATIVE mg/dL   Bilirubin Urine NEGATIVE NEGATIVE   Ketones, ur NEGATIVE NEGATIVE mg/dL   Specific Gravity, Urine 1.025 1.005 - 1.030   Hgb urine dipstick LARGE (A) NEGATIVE   pH 6.0 5.0 - 8.0   Protein, ur 100 (A) NEGATIVE mg/dL   Urobilinogen, UA 0.2 0.0 - 1.0 mg/dL   Nitrite NEGATIVE NEGATIVE   Leukocytes,Ua NEGATIVE NEGATIVE  Assessment and Plan :   I have reviewed the PDMP during this encounter.  1. Right flank pain   2. Renal colic on right side   3. Hematuria, unspecified type   4. Dysuria     Patient is in no acute distress.  He has recurrent renal colic.  Recommended maintaining his daily medications, refilled hydrocodone.  Emphasized need for follow-up with his urologist tomorrow. Counseled patient on potential for adverse effects with medications prescribed/recommended today, ER and return-to-clinic precautions discussed, patient verbalized understanding.    Jaynee Eagles, [place] 01/26/20 1115

## 2021-01-10 ENCOUNTER — Ambulatory Visit
Admission: EM | Admit: 2021-01-10 | Discharge: 2021-01-10 | Disposition: A | Discharge status: Home / Self Care | Payer: Self-pay | Attending: Urgent Care | Admitting: Urgent Care

## 2021-01-10 DIAGNOSIS — R319 Hematuria, unspecified: Secondary | ICD-10-CM

## 2021-01-10 DIAGNOSIS — Z87442 Personal history of urinary calculi: Secondary | ICD-10-CM

## 2021-01-10 DIAGNOSIS — R109 Unspecified abdominal pain: Secondary | ICD-10-CM

## 2021-01-10 DIAGNOSIS — R300 Dysuria: Secondary | ICD-10-CM

## 2021-01-10 DIAGNOSIS — K644 Residual hemorrhoidal skin tags: Secondary | ICD-10-CM

## 2021-01-10 LAB — POCT URINALYSIS DIP (MANUAL ENTRY)
Bilirubin, UA: NEGATIVE
Glucose, UA: NEGATIVE mg/dL
Ketones, POC UA: NEGATIVE mg/dL
Nitrite, UA: NEGATIVE
Protein Ur, POC: 100 mg/dL — AB
Spec Grav, UA: >=1.03 — AB (ref 1.010–1.025)
Urobilinogen, UA: 1 E.U./dL
pH, UA: 6.5 (ref 5.0–8.0)

## 2021-01-10 MED ORDER — HYDROCORTISONE (PERIANAL) 2.5 % EX CREA
1.0000 | TOPICAL_CREAM | Freq: Two times a day (BID) | CUTANEOUS | 0 refills | Status: DC
Start: 2021-01-10 — End: 2022-10-12

## 2021-01-10 MED ORDER — DOCUSATE SODIUM 100 MG PO CAPS
100.0000 mg | ORAL_CAPSULE | Freq: Two times a day (BID) | ORAL | 0 refills | Status: DC
Start: 2021-01-10 — End: 2021-08-27

## 2021-01-10 MED ORDER — TAMSULOSIN HCL 0.4 MG PO CAPS: 0.4000 mg | ORAL_CAPSULE | Freq: Every day | ORAL | 3 refills | Status: DC

## 2021-01-10 MED ORDER — HYDROCODONE-ACETAMINOPHEN 5-325 MG PO TABS: 1.0000 | ORAL_TABLET | Freq: Four times a day (QID) | ORAL | 0 refills | Status: DC | PRN

## 2021-01-10 NOTE — ED Triage Notes (Signed)
Patient presents to Urgent Care with complaints of flank pain and abdominal pain. He states he has a hx of bladder stones. Pt states he sees urology who prescribed Flomax and hydrocodone for symptom management. Pt states he needs med refill.   Denies fever.

## 2021-01-10 NOTE — ED Provider Notes (Signed)
[place]   MRN: [id number] DOB: [birth year]  Subjective:   Jackson Sullivan is a 60 y.o. male presenting for several day history of recurrent bilateral flank pain, pelvic pain, hematuria.  Patient has a history of recurrent nephrolithiasis.  He has not seen his urologist in over a year.  Has had shockwave lithotripsy in 2012, 2017.  He has concerns about his prostate.  Reports that sometimes is hard for him to urinate.  He is also had trouble defecating.  Would like medication for this as he has to strain a lot.  Has felt masses over the anal area and would like it evaluated as well.  Denies fever, nausea, vomiting, chest pain, shortness of breath, inability to urinate.  No current facility-administered medications for this encounter.  Current Outpatient Medications:    acetaminophen (TYLENOL) 500 MG tablet, Take 500 mg by mouth every 6 (six) hours as needed., Disp: , Rfl:    HYDROcodone-acetaminophen (NORCO/VICODIN) 5-325 MG tablet, Take 1 tablet by mouth every 6 (six) hours as needed for severe pain., Disp: 20 tablet, Rfl: 0   oxybutynin (DITROPAN) 5 MG tablet, Take 1 tablet (5 mg total) by mouth every 8 (eight) hours as needed for bladder spasms., Disp: 30 tablet, Rfl: 1   tamsulosin (FLOMAX) 0.4 MG CAPS capsule, Take 1 capsule (0.4 mg total) by mouth daily., Disp: 30 capsule, Rfl: 3   No Known Allergies  Past Medical History:  Diagnosis Date   Bladder stones    Dysuria    ED (erectile dysfunction)    Frequency of urination    Hematuria    History of hypertension    PER PT 2015  NO LONGER AN ISSUE   History of kidney stones    Hypertension    Nephrolithiasis    BILATERAL    Rash    LEGS      Past Surgical History:  Procedure Laterality Date   COLONOSCOPY N/A 05/17/2013   Procedure: COLONOSCOPY;  Surgeon: Beryle Beams, MD;  Location: [facility];  Service: Endoscopy;  Laterality: N/A;   CYSTOSCOPY W/ RETROGRADES Bilateral 01/26/2016   Procedure:  CYSTOSCOPY WITH RETROGRADE PYELOGRAM;  Surgeon: Festus Aloe, MD;  Location: [facility];  Service: Urology;  Laterality: Bilateral;   CYSTOSCOPY WITH BIOPSY  01/26/2016   Procedure: CYSTOSCOPY WITH  URETHRAL BIOPSY;  Surgeon: Festus Aloe, MD;  Location: [facility];  Service: Urology;;   CYSTOSCOPY WITH LITHOLAPAXY N/A 01/26/2016   Procedure: CYSTOSCOPY WITH LITHOLAPAXY;  Surgeon: Festus Aloe, MD;  Location: [facility];  Service: Urology;  Laterality: N/A;   CYSTOSCOPY WITH LITHOLAPAXY N/A 10/18/2019   Procedure: CYSTOSCOPY WITH LITHOLAPAXY, TRANS INCISIONAL EXCISION OF PROSTATE, BLADDER FULGERATION;  Surgeon: Ceasar Mons, MD;  Location: [facility];  Service: Urology;  Laterality: N/A;   CYSTOSCOPY WITH STENT PLACEMENT Left 01/26/2016   Procedure: CYSTOSCOPY WITH STENT PLACEMENT;  Surgeon: Festus Aloe, MD;  Location: [facility];  Service: Urology;  Laterality: Left;   EXTRACORPOREAL SHOCK WAVE LITHOTRIPSY Left 12-14-2015;   03-07-2011   HOLMIUM LASER APPLICATION N/A [id number]   Procedure: HOLMIUM LASER APPLICATION;  Surgeon: Festus Aloe, MD;  Location: [facility];  Service: Urology;  Laterality: N/A;   HOLMIUM LASER APPLICATION  [id number]   Procedure: HOLMIUM LASER APPLICATION;  Surgeon: Ceasar Mons, MD;  Location: [facility];  Service: Urology;;   ORCHIECTOMY  1995    in [place]   STONE EXTRACTION WITH BASKET  Left 01/26/2016   Procedure: STONE EXTRACTION WITH BASKET;  Surgeon: Festus Aloe, MD;  Location: [facility];  Service: Urology;  Laterality: Left;   URETEROSCOPY Left 01/26/2016   Procedure: CYSTOSCOPY WITH  URETEROSCOPY;  Surgeon: Festus Aloe, MD;  Location: [facility];  Service: Urology;  Laterality: Left;   URETEROSCOPY WITH HOLMIUM LASER LITHOTRIPSY Bilateral 10/18/2019   Procedure:  URETEROSCOPY WITH HOLMIUM LASER LITHOTRIPSY/ STENT PLACEMENT;  Surgeon: Ceasar Mons, MD;  Location: [facility];  Service: Urology;  Laterality: Bilateral;    History reviewed. No pertinent family history.  Social History   Tobacco Use   Smoking status: Every Day    Packs/day: 0.25    Years: 25.00    Pack years: 6.25    Types: Cigarettes   Smokeless tobacco: Never   Tobacco comments:    3-4 CIG PER DAY  Vaping Use   Vaping Use: Never used  Substance Use Topics   Alcohol use: No    Alcohol/week: 0.0 standard drinks    Comment: Quit 25 yrs ago   Drug use: No    ROS   Objective:   Vitals: BP (S) (!) 159/96 (BP Location: Left Arm)   Pulse 72   Temp 98.2 F (36.8 C) (Oral)   Resp 16   SpO2 98%   Physical Exam Constitutional:      General: He is not in acute distress.    Appearance: Normal appearance. He is well-developed and normal weight. He is not ill-appearing, toxic-appearing or diaphoretic.  HENT:     Head: Normocephalic and atraumatic.     Right Ear: External ear normal.     Left Ear: External ear normal.     Nose: Nose normal.     Mouth/Throat:     Pharynx: Oropharynx is clear.  Eyes:     General: No scleral icterus.       Right eye: No discharge.        Left eye: No discharge.     Extraocular Movements: Extraocular movements intact.     Pupils: Pupils are equal, round, and reactive to light.  Cardiovascular:     Rate and Rhythm: Normal rate.  Pulmonary:     Effort: Pulmonary effort is normal.  Abdominal:     General: Bowel sounds are normal. There is no distension.     Palpations: Abdomen is soft.     Tenderness: There is abdominal tenderness in the suprapubic area. There is right CVA tenderness and left CVA tenderness. There is no guarding or rebound.  Genitourinary:   Musculoskeletal:     Cervical back: Normal range of motion.  Neurological:     Mental Status: He is alert and oriented to person, place, and time.   Psychiatric:        Mood and Affect: Mood normal.        Behavior: Behavior normal.        Thought Content: Thought content normal.        Judgment: Judgment normal.    Results for orders placed or performed during the hospital encounter of 01/10/21 (from the past 24 hour(s))  POCT urinalysis dipstick     Status: Abnormal   Collection Time: 01/10/21 12:56 PM  Result Value Ref Range   Color, UA yellow yellow   Clarity, UA cloudy (A) clear   Glucose, UA negative negative mg/dL   Bilirubin, UA negative negative   Ketones, POC UA negative negative mg/dL   Spec Grav, UA >=1.030 (  A) 1.010 - 1.025   Blood, UA large (A) negative   pH, UA 6.5 5.0 - 8.0   Protein Ur, POC =100 (A) negative mg/dL   Urobilinogen, UA 1.0 0.2 or 1.0 E.U./dL   Nitrite, UA Negative Negative   Leukocytes, UA Trace (A) Negative    Assessment and Plan :   I have reviewed the PDMP during this encounter.  1. Flank pain   2. History of renal stone   3. Hematuria, unspecified type   4. Dysuria   5. External hemorrhoids     Suspect recurrent renal colic, recommended Flomax, aggressive hydration, hydrocodone for breakthrough pain.  Schedule Tylenol for regular pain.  Emphasized need to follow-up with urology, provided him with information for [facility].  Start hydrocortisone rectal cream for nonthrombosed external hemorrhoids, docusate stool softener as well to help him with his bowel movements.  Follow-up with [facility]. Counseled patient on potential for adverse effects with medications prescribed/recommended today, ER and return-to-clinic precautions discussed, patient verbalized understanding.    Jaynee Eagles, [place] 01/10/21 [id number]

## 2021-03-03 ENCOUNTER — Other Ambulatory Visit: Payer: Self-pay

## 2021-03-03 ENCOUNTER — Ambulatory Visit
Admission: EM | Admit: 2021-03-03 | Discharge: 2021-03-03 | Disposition: A | Discharge status: Home / Self Care | Payer: Self-pay | Attending: Internal Medicine | Admitting: Internal Medicine

## 2021-03-03 DIAGNOSIS — B353 Tinea pedis: Secondary | ICD-10-CM

## 2021-03-03 DIAGNOSIS — R109 Unspecified abdominal pain: Secondary | ICD-10-CM

## 2021-03-03 LAB — POCT URINALYSIS DIP (MANUAL ENTRY)
Bilirubin, UA: NEGATIVE
Glucose, UA: NEGATIVE mg/dL
Ketones, POC UA: NEGATIVE mg/dL
Leukocytes, UA: NEGATIVE
Nitrite, UA: NEGATIVE
Protein Ur, POC: 30 mg/dL — AB
Spec Grav, UA: 1.025 (ref 1.010–1.025)
Urobilinogen, UA: 1 E.U./dL
pH, UA: 6 (ref 5.0–8.0)

## 2021-03-03 MED ORDER — MICONAZOLE NITRATE 2 % EX POWD: CUTANEOUS | 0 refills | Status: DC | PRN

## 2021-03-03 NOTE — ED Provider Notes (Signed)
[facility]    CSN: [id number] Arrival date & time: 03/03/21  1884      History   Chief Complaint Chief Complaint  Patient presents with   skin lesions on foot; "kidney stones"    HPI Jackson Sullivan is a 60 y.o. male.   Patient presents with bilateral back pain that radiates down into pelvis and bilateral legs that has been present for approximately 1 month.  Patient reports that he "passed a kidney stone", but pain has persisted.  Patient reports that he has a history of multiple kidney stones where he passes 4-5 each month.  Has history of having to have lithotripsy completed.  Is followed by urology, but last visit to urology was in May 2021 per patient.  Patient takes hydrocodone daily as well as Flomax.  Patient reports that "kidney pain" typically persists after passing a kidney stone.  Patient denies any urinary burning, urinary frequency, hematuria, fevers.  Patient also has elevated blood pressure reading today.  Patient denies taking any daily blood pressure medication.  Patient attributes high blood pressure to pain that he is experiencing.  Patient also has discomfort to bilateral toes of feet.  Patient reports that pain started approximately 2 weeks ago.  Patient has been using Lotrimin cream to affected area with no resolution or improvement in symptoms.  Denies history of diabetes.  Denies fever.  Denies numbness or tingling to bilateral feet.    Past Medical History:  Diagnosis Date   Bladder stones    Dysuria    ED (erectile dysfunction)    Frequency of urination    Hematuria    History of hypertension    PER PT 2015  NO LONGER AN ISSUE   History of kidney stones    Hypertension    Nephrolithiasis    BILATERAL    Rash    LEGS     Patient Active Problem List   Diagnosis Date Noted   Kidney stones 11/16/2015    Past Surgical History:  Procedure Laterality Date   COLONOSCOPY N/A 05/17/2013   Procedure: COLONOSCOPY;  Surgeon: Beryle Beams, MD;  Location: [facility];  Service: Endoscopy;  Laterality: N/A;   CYSTOSCOPY W/ RETROGRADES Bilateral 01/26/2016   Procedure: CYSTOSCOPY WITH RETROGRADE PYELOGRAM;  Surgeon: Festus Aloe, MD;  Location: [facility];  Service: Urology;  Laterality: Bilateral;   CYSTOSCOPY WITH BIOPSY  01/26/2016   Procedure: CYSTOSCOPY WITH  URETHRAL BIOPSY;  Surgeon: Festus Aloe, MD;  Location: [facility];  Service: Urology;;   CYSTOSCOPY WITH LITHOLAPAXY N/A 01/26/2016   Procedure: CYSTOSCOPY WITH LITHOLAPAXY;  Surgeon: Festus Aloe, MD;  Location: [facility];  Service: Urology;  Laterality: N/A;   CYSTOSCOPY WITH LITHOLAPAXY N/A 10/18/2019   Procedure: CYSTOSCOPY WITH LITHOLAPAXY, TRANS INCISIONAL EXCISION OF PROSTATE, BLADDER FULGERATION;  Surgeon: Ceasar Mons, MD;  Location: [facility];  Service: Urology;  Laterality: N/A;   CYSTOSCOPY WITH STENT PLACEMENT Left 01/26/2016   Procedure: CYSTOSCOPY WITH STENT PLACEMENT;  Surgeon: Festus Aloe, MD;  Location: [facility];  Service: Urology;  Laterality: Left;   EXTRACORPOREAL SHOCK WAVE LITHOTRIPSY Left 12-14-2015;   03-07-2011   HOLMIUM LASER APPLICATION N/A 1/66/0630   Procedure: HOLMIUM LASER APPLICATION;  Surgeon: Festus Aloe, MD;  Location: [facility];  Service: Urology;  Laterality: N/A;   HOLMIUM LASER APPLICATION  1/60/1093   Procedure: HOLMIUM LASER APPLICATION;  Surgeon: Ceasar Mons, MD;  Location: [facility];  Service: Urology;;   ORCHIECTOMY  1995    in [place]   STONE EXTRACTION WITH BASKET Left 01/26/2016   Procedure: STONE EXTRACTION WITH BASKET;  Surgeon: Festus Aloe, MD;  Location: [facility];  Service: Urology;  Laterality: Left;   URETEROSCOPY Left 01/26/2016   Procedure: CYSTOSCOPY WITH  URETEROSCOPY;  Surgeon: Festus Aloe, MD;  Location: [facility];  Service: Urology;  Laterality: Left;   URETEROSCOPY WITH HOLMIUM LASER LITHOTRIPSY Bilateral 10/18/2019   Procedure: URETEROSCOPY WITH HOLMIUM LASER LITHOTRIPSY/ STENT PLACEMENT;  Surgeon: Ceasar Mons, MD;  Location: [facility];  Service: Urology;  Laterality: Bilateral;       Home Medications    Prior to Admission medications   Medication Sig Start Date End Date Taking? Authorizing Provider  miconazole (MICOTIN) 2 % powder Apply topically as needed for itching. Apply twice daily for 1-4 weeks or until resolution of symptoms 03/03/21  Yes Odis Luster, FNP  acetaminophen (TYLENOL) 500 MG tablet Take 500 mg by mouth every 6 (six) hours as needed.    [provider]  docusate sodium (COLACE) 100 MG capsule Take 1 capsule (100 mg total) by mouth every 12 (twelve) hours. 01/10/21   Jaynee Eagles, PA-C  HYDROcodone-acetaminophen (NORCO/VICODIN) 5-325 MG tablet Take 1 tablet by mouth every 6 (six) hours as needed for severe pain. 01/10/21   Jaynee Eagles, PA-C  hydrocortisone (ANUSOL-HC) 2.5 % rectal cream Place 1 application rectally 2 (two) times daily. 01/10/21   Jaynee Eagles, PA-C  oxybutynin (DITROPAN) 5 MG tablet Take 1 tablet (5 mg total) by mouth every 8 (eight) hours as needed for bladder spasms. 10/18/19   Ceasar Mons, MD  tamsulosin (FLOMAX) 0.4 MG CAPS capsule Take 1 capsule (0.4 mg total) by mouth daily. 01/10/21   Jaynee Eagles, PA-C    Family History History reviewed. No pertinent family history.  Social History Social History   Tobacco Use   Smoking status: Every Day    Packs/day: 0.25    Years: 25.00    Pack years: 6.25    Types: Cigarettes   Smokeless tobacco: Never   Tobacco comments:    3-4 CIG PER DAY  Vaping Use   Vaping Use: Never used  Substance Use Topics   Alcohol use: No    Alcohol/week: 0.0 standard drinks    Comment: Quit 25 yrs ago   Drug use: No     Allergies   Patient has no known  allergies.   Review of Systems Review of Systems Per HPI  Physical Exam Triage Vital Signs ED Triage Vitals [03/03/21 1018]  Enc Vitals Group     BP (!) 203/104     Pulse Rate 82     Resp 18     Temp 97.8 F (36.6 C)     Temp Source Oral     SpO2 98 %     Weight      Height      Head Circumference      Peak Flow      Pain Score 10     Pain Loc      Pain Edu?      Excl. in [place]?    No data found.  Updated Vital Signs BP (!) 173/93 (BP Location: Left Arm)   Pulse 66   Temp 97.8 F (36.6 C) (Oral)   Resp 18   SpO2 98%   Visual Acuity Right Eye Distance:   Left  Eye Distance:   Bilateral Distance:    Right Eye Near:   Left Eye Near:    Bilateral Near:     Physical Exam Constitutional:      General: He is not in acute distress.    Appearance: Normal appearance. He is not toxic-appearing or diaphoretic.  HENT:     Head: Normocephalic and atraumatic.  Eyes:     Extraocular Movements: Extraocular movements intact.     Conjunctiva/sclera: Conjunctivae normal.  Cardiovascular:     Rate and Rhythm: Normal rate and regular rhythm.     Pulses: Normal pulses.     Heart sounds: Normal heart sounds.  Pulmonary:     Effort: Pulmonary effort is normal. No respiratory distress.     Breath sounds: Normal breath sounds.  Abdominal:     General: Bowel sounds are normal. There is no distension.     Palpations: Abdomen is soft.     Tenderness: There is no abdominal tenderness.  Musculoskeletal:     Cervical back: Normal.     Thoracic back: Normal.     Lumbar back: Tenderness present.     Comments: Tenderness to palpation to bilateral lower lumbar region.  Feet:     Right foot:     Skin integrity: Erythema and warmth present.     Left foot:     Skin integrity: Erythema and warmth present.     Comments: Erythema, purulent discharge, moist area in between fourth and fifth toes of right foot and third and fourth toes of left foot.  Also has malodorous purulent drainage.   Neurovascular intact.  Tender to palpation. Skin:    General: Skin is warm.  Neurological:     General: No focal deficit present.     Mental Status: He is alert and oriented to person, place, and time. Mental status is at baseline.     Deep Tendon Reflexes: Reflexes are normal and symmetric.  Psychiatric:        Mood and Affect: Mood normal.        Behavior: Behavior normal.        Thought Content: Thought content normal.        Judgment: Judgment normal.     UC Treatments / Results  Labs (all labs ordered are listed, but only abnormal results are displayed) Labs Reviewed  POCT URINALYSIS DIP (MANUAL ENTRY) - Abnormal; Notable for the following components:      Result Value   Blood, UA large (*)    Protein Ur, POC =30 (*)    All other components within normal limits    EKG   Radiology No results found.  Procedures Procedures (including critical care time)  Medications Ordered in UC Medications - No data to display  Initial Impression / Assessment and Plan / UC Course  I have reviewed the triage vital signs and the nursing notes.  Pertinent labs & imaging results that were available during my care of the patient were reviewed by me and considered in my medical decision making (see chart for details).     Physical exam of feet seem consistent with fungal infection or tinea pedis.  Will treat with miconazole powder.  Advised patient to keep feet dry.  Advised patient to wash with Epson salt, pat dry, and use liberal amount of powder.  Also advised patient to wear thin socks, breathable shoes, and to change socks throughout the day.  Patient to follow-up with urgent care or PCP if symptoms persist.  Patient has  had flank pain for approximately 1 month and hematuria is noted on urinalysis.  Patient will need further evaluation and imaging completed to rule out worrisome etiologies such as obstruction.  Discussed clinical course with supervising physician (Dr. Lanny Cramp).   Patient was advised to go the hospital for further evaluation and management.  Patient was agreeable to plan.  Vital signs fairly stable at discharge.  Agree with patient self transport to the hospital. Final Clinical Impressions(s) / UC Diagnoses   Final diagnoses:  Tinea pedis of both feet  Flank pain     Discharge Instructions      Please go to the hospital as soon as you leave urgent care for further evaluation and management of kidney pain.  You have been prescribed miconazole powder to treat fungal infection of the feet.  Please monitor your feet very closely and follow-up if any worsening occurs.  Change socks throughout the day and use miconazole powder every time socks are changed.  Do not wear 2 pairs of socks or thick socks.  Keep feet dry.  Please wash feet with Epson salt, pat dry, and apply miconazole powder.  Do not wear shoes at home.     ED Prescriptions     Medication Sig Dispense Auth. Provider   miconazole (MICOTIN) 2 % powder Apply topically as needed for itching. Apply twice daily for 1-4 weeks or until resolution of symptoms 70 g Odis Luster, FNP      PDMP not reviewed this encounter.   Odis Luster, FNP 03/03/21 1115

## 2021-03-03 NOTE — Discharge Instructions (Addendum)
Please go to the hospital as soon as you leave urgent care for further evaluation and management of kidney pain.  You have been prescribed miconazole powder to treat fungal infection of the feet.  Please monitor your feet very closely and follow-up if any worsening occurs.  Change socks throughout the day and use miconazole powder every time socks are changed.  Do not wear 2 pairs of socks or thick socks.  Keep feet dry.  Please wash feet with Epson salt, pat dry, and apply miconazole powder.  Do not wear shoes at home.

## 2021-03-03 NOTE — ED Triage Notes (Signed)
Pt c/o feet "cracking" onset about 2 weeks. States it is very painful. Area is affecting both feet. The lesion appears fissure-like between 4th and 5th digits with surrounding grey-ish color. Discharge appears serosanguinous, minimal. States he has been using lotramin at home without relief.   Also states last month he excreted a "kidney stone" which he has had multiple times before. He was treated with Flomax last month states the stones passed but he is still experiencing pain.

## 2021-04-01 ENCOUNTER — Encounter: Payer: Self-pay | Admitting: Podiatry

## 2021-04-01 ENCOUNTER — Other Ambulatory Visit: Payer: Self-pay

## 2021-04-01 ENCOUNTER — Ambulatory Visit (INDEPENDENT_AMBULATORY_CARE_PROVIDER_SITE_OTHER): Payer: Self-pay | Admitting: Podiatry

## 2021-04-01 ENCOUNTER — Ambulatory Visit: Payer: Self-pay

## 2021-04-01 DIAGNOSIS — B353 Tinea pedis: Secondary | ICD-10-CM

## 2021-04-01 MED ORDER — KETOCONAZOLE 2 % EX CREA
1.0000 | TOPICAL_CREAM | Freq: Every day | CUTANEOUS | 2 refills | Status: DC
Start: 2021-04-01 — End: 2022-10-12

## 2021-04-01 NOTE — Progress Notes (Signed)
G

## 2021-04-01 NOTE — Progress Notes (Signed)
  Subjective:  Patient ID: Jackson Sullivan, male    DOB: 04/08/1961,   MRN: [id number]  Chief Complaint  Patient presents with   blisters    Bilateral     60 y.o. male presents for pain in between bilateral toes. Relates its bentwee the fourth and fifth on the right and third and fourth on the left. Relates odor and drainage. Has tried using baby power and epsom salt soaks. States he noticed the blisters 2 months ago.  Is currently on a blood thinner.  Denies any other pedal complaints. Denies n/v/f/c.   Past Medical History:  Diagnosis Date   Bladder stones    Dysuria    ED (erectile dysfunction)    Frequency of urination    Hematuria    History of hypertension    PER PT 2015  NO LONGER AN ISSUE   History of kidney stones    Hypertension    Nephrolithiasis    BILATERAL    Rash    LEGS     Objective:  Physical Exam: Vascular: DP/PT pulses 2/4 bilateral. CFT <3 seconds. Normal hair growth on digits. No edema.  Skin. No lacerations or abrasions bilateral feet. Interspace left third and right fourth interspace with maceration and erythema surrounding.  Musculoskeletal: MMT 5/5 bilateral lower extremities in DF, PF, Inversion and Eversion. Deceased ROM in DF of ankle joint.  Neurological: Sensation intact to light touch.   Assessment:   1. Tinea pedis of both feet      Plan:  Patient was evaluated and treated and all questions answered. -Examined patient -Discussed treatment options for tinea pedis -Instructed patient to apply betadine into these areas for the next week.  -The next week he will apply the ketoconazole cream -Patient to return in 2 weeks for follow up evaluation and discussion of fungal culture results or sooner if symptoms worsen.   Lorenda Peck, DPM

## 2021-04-19 ENCOUNTER — Encounter: Payer: Self-pay | Admitting: Podiatry

## 2021-04-19 ENCOUNTER — Ambulatory Visit (INDEPENDENT_AMBULATORY_CARE_PROVIDER_SITE_OTHER): Payer: Self-pay | Admitting: Podiatry

## 2021-04-19 ENCOUNTER — Other Ambulatory Visit: Payer: Self-pay

## 2021-04-19 DIAGNOSIS — B353 Tinea pedis: Secondary | ICD-10-CM

## 2021-04-19 MED ORDER — CEPHALEXIN 500 MG PO CAPS: 500.0000 mg | ORAL_CAPSULE | Freq: Four times a day (QID) | ORAL | 0 refills | Status: DC

## 2021-04-19 MED ORDER — MELOXICAM 15 MG PO TABS
15.0000 mg | ORAL_TABLET | Freq: Every day | ORAL | 0 refills | Status: DC
Start: 2021-04-19 — End: 2021-05-16

## 2021-04-19 NOTE — Progress Notes (Signed)
  Subjective:  Patient ID: KENRIC GINGER, male    DOB: [birth year],   MRN: [id number]  Chief Complaint  Patient presents with   Foot Pain    I am a little better and the pain is sharp and I can hardly sleep and I am losing weight and there is some white color draining from toes     60 y.o. male presents for follow-up of maceration in web spaces right fourth and left third. Relates he has been applying the betadine. Relates continued pain but it has improved a little bit. Was using the betadine and switched over to the ketoconazole  .  Is currently on a blood thinner.  Denies any other pedal complaints. Denies n/v/f/c.   Past Medical History:  Diagnosis Date   Bladder stones    Dysuria    ED (erectile dysfunction)    Frequency of urination    Hematuria    History of hypertension    PER PT 2015  NO LONGER AN ISSUE   History of kidney stones    Hypertension    Nephrolithiasis    BILATERAL    Rash    LEGS     Objective:  Physical Exam: Vascular: DP/PT pulses 2/4 bilateral. CFT <3 seconds. Normal hair growth on digits. No edema.  Skin. No lacerations or abrasions bilateral feet. Interspace left third  and fourth and right third and fourth interspace with maceration and erythema surrounding. Some improvement.  Musculoskeletal: MMT 5/5 bilateral lower extremities in DF, PF, Inversion and Eversion. Deceased ROM in DF of ankle joint.  Neurological: Sensation intact to light touch.   Assessment:   1. Tinea pedis of both feet       Plan:  Patient was evaluated and treated and all questions answered. -Examined patient -Discussed treatment options for tinea pedis -Instructed patient to apply betadine into these areas for the next two weeks  -Patient with severe pain in this areas prescription for meloxicam provided.  -Keflex provided as right side appeared slightly more erythematous.  -Patient to return in 2 weeks for follow up evaluation    Lorenda Peck, DPM

## 2021-05-03 ENCOUNTER — Other Ambulatory Visit: Payer: Self-pay | Admitting: Podiatry

## 2021-05-03 ENCOUNTER — Encounter: Payer: Self-pay | Admitting: Podiatry

## 2021-05-03 ENCOUNTER — Other Ambulatory Visit: Payer: Self-pay

## 2021-05-03 ENCOUNTER — Ambulatory Visit (INDEPENDENT_AMBULATORY_CARE_PROVIDER_SITE_OTHER): Payer: Self-pay | Admitting: Podiatry

## 2021-05-03 DIAGNOSIS — B353 Tinea pedis: Secondary | ICD-10-CM

## 2021-05-03 NOTE — Progress Notes (Signed)
  Subjective:  Patient ID: OAKLAND FANT, male    DOB: 08/05/1960,   MRN: [id number]  Chief Complaint  Patient presents with   Foot Pain    The spaces in between the 4th and 5th on both feet are doing better and there is not a lot of draining and I have been using betadine     60 y.o. male presents for follow-up of maceration in web spaces right fourth and left third. Relates he has been applying the betadine. Relates it has improved a lot. States hehas some itchiness to the top of his foot. .  Is currently on a blood thinner.  Denies any other pedal complaints. Denies n/v/f/c.   Past Medical History:  Diagnosis Date   Bladder stones    Dysuria    ED (erectile dysfunction)    Frequency of urination    Hematuria    History of hypertension    PER PT 2015  NO LONGER AN ISSUE   History of kidney stones    Hypertension    Nephrolithiasis    BILATERAL    Rash    LEGS     Objective:  Physical Exam: Vascular: DP/PT pulses 2/4 bilateral. CFT <3 seconds. Normal hair growth on digits. No edema.  Skin. No lacerations or abrasions bilateral feet. Interspace left third  and fourth and right third and fourth interspace with maceration and erythema surrounding. Improved form last visit.  Musculoskeletal: MMT 5/5 bilateral lower extremities in DF, PF, Inversion and Eversion. Deceased ROM in DF of ankle joint.  Neurological: Sensation intact to light touch.   Assessment:   1. Tinea pedis of both feet       Plan:  Patient was evaluated and treated and all questions answered. -Examined patient -Discussed treatment options for tinea pedis -Instructed patient to apply betadine into these areas in between toes -Finished antibiotics and no additional needed at this time.  -Recommend using ketoconazole on dry skin on dorsa foot.  -Patient to return in 4 weeks for follow up evaluation    Lorenda Peck, DPM

## 2021-05-03 NOTE — Telephone Encounter (Signed)
Please advise 

## 2021-05-16 ENCOUNTER — Other Ambulatory Visit: Payer: Self-pay | Admitting: Podiatry

## 2021-05-16 NOTE — Telephone Encounter (Signed)
Please advise 

## 2021-06-14 ENCOUNTER — Other Ambulatory Visit: Payer: Self-pay

## 2021-06-14 ENCOUNTER — Encounter: Payer: Self-pay | Admitting: Podiatry

## 2021-06-14 ENCOUNTER — Ambulatory Visit (INDEPENDENT_AMBULATORY_CARE_PROVIDER_SITE_OTHER): Payer: Self-pay | Admitting: Podiatry

## 2021-06-14 DIAGNOSIS — B353 Tinea pedis: Secondary | ICD-10-CM

## 2021-06-14 NOTE — Progress Notes (Signed)
°  Subjective:  Patient ID: Jackson Sullivan, male    DOB: [birth year],   MRN: [id number]  Chief Complaint  Patient presents with   Tinea Pedis    F/u BL tinea pedis - pt states doing much better. Completed antibiotic. Applying cream and the betadine      61 y.o. male presents for follow-up of maceration in web spaces right fourth and left third. Relates he has been applying the betadine. Relates it has improved a lot.  Is currently on a blood thinner.  Denies any other pedal complaints. Denies n/v/f/c.   Past Medical History:  Diagnosis Date   Bladder stones    Dysuria    ED (erectile dysfunction)    Frequency of urination    Hematuria    History of hypertension    PER PT 2015  NO LONGER AN ISSUE   History of kidney stones    Hypertension    Nephrolithiasis    BILATERAL    Rash    LEGS     Objective:  Physical Exam: Vascular: DP/PT pulses 2/4 bilateral. CFT <3 seconds. Normal hair growth on digits. No edema.  Skin. No lacerations or abrasions bilateral feet. Interspace left third  and fourth and right third and fourth interspace with  mild maceration. Has improved a lot. No erythema noted.  Musculoskeletal: MMT 5/5 bilateral lower extremities in DF, PF, Inversion and Eversion. Deceased ROM in DF of ankle joint.  Neurological: Sensation intact to light touch.   Assessment:   1. Tinea pedis of both feet        Plan:  Patient was evaluated and treated and all questions answered. -Examined patient -Discussed treatment options for tinea pedis -Instructed patient to apply betadine into these areas in between toes for another month then may discontinue. Discussed keeping in between his toes dry.  -Finished antibiotics and no additional needed at this time.  -Recommend using ketoconazole on dry skin on dorsal foot.  -Patient to return as needed.    Lorenda Peck, DPM

## 2021-07-01 ENCOUNTER — Other Ambulatory Visit: Payer: Self-pay | Admitting: Podiatry

## 2021-07-05 NOTE — Telephone Encounter (Signed)
Please advise 

## 2021-08-07 ENCOUNTER — Other Ambulatory Visit: Payer: Self-pay | Admitting: Podiatry

## 2021-08-27 ENCOUNTER — Other Ambulatory Visit: Payer: Self-pay

## 2021-08-27 ENCOUNTER — Ambulatory Visit (HOSPITAL_COMMUNITY)
Admission: EM | Admit: 2021-08-27 | Discharge: 2021-08-27 | Disposition: A | Discharge status: Home / Self Care | Payer: BC Managed Care – PPO | Attending: Nurse Practitioner | Admitting: Nurse Practitioner

## 2021-08-27 ENCOUNTER — Encounter (HOSPITAL_COMMUNITY): Payer: Self-pay | Admitting: Emergency Medicine

## 2021-08-27 DIAGNOSIS — M545 Low back pain, unspecified: Secondary | ICD-10-CM | POA: Insufficient documentation

## 2021-08-27 DIAGNOSIS — R310 Gross hematuria: Secondary | ICD-10-CM | POA: Insufficient documentation

## 2021-08-27 DIAGNOSIS — R300 Dysuria: Secondary | ICD-10-CM | POA: Diagnosis not present

## 2021-08-27 LAB — POCT URINALYSIS DIPSTICK, ED / UC
Bilirubin Urine: NEGATIVE
Glucose, UA: NEGATIVE mg/dL
Ketones, ur: NEGATIVE mg/dL
Nitrite: NEGATIVE
Protein, ur: 100 mg/dL — AB
Specific Gravity, Urine: 1.02 (ref 1.005–1.030)
Urobilinogen, UA: 0.2 mg/dL (ref 0.0–1.0)
pH: 6 (ref 5.0–8.0)

## 2021-08-27 MED ORDER — DOCUSATE SODIUM 100 MG PO CAPS: 100.0000 mg | ORAL_CAPSULE | Freq: Two times a day (BID) | ORAL | 0 refills | Status: DC

## 2021-08-27 MED ORDER — HYDROCODONE-ACETAMINOPHEN 5-325 MG PO TABS: 1.0000 | ORAL_TABLET | Freq: Four times a day (QID) | ORAL | 0 refills | Status: DC | PRN

## 2021-08-27 MED ORDER — SULFAMETHOXAZOLE-TRIMETHOPRIM 800-160 MG PO TABS: 1.0000 | ORAL_TABLET | Freq: Two times a day (BID) | ORAL | 0 refills | Status: AC

## 2021-08-27 MED ORDER — OXYBUTYNIN CHLORIDE 5 MG PO TABS: 5.0000 mg | ORAL_TABLET | Freq: Three times a day (TID) | ORAL | 0 refills | Status: DC | PRN

## 2021-08-27 MED ORDER — TAMSULOSIN HCL 0.4 MG PO CAPS: 0.4000 mg | ORAL_CAPSULE | Freq: Every day | ORAL | 0 refills | Status: DC

## 2021-08-27 NOTE — ED Triage Notes (Signed)
Pt reports dysuria and hematuria x 1 week. States hx of kidney stones.  ?

## 2021-08-27 NOTE — Discharge Instructions (Signed)
-   The urine sample today shows blood and some possible bacteria. ?-Please start the antibiotic in case there is a urinary tract infection; I think you may also have kidney stones ?-Please start the Flomax, pain medication, and medicine for bladder spasms.  Please also take Colace to help prevent constipation while on pain medication. ?-Please follow-up with urology if your symptoms persist longer than 1 week - the number is provided below ?-I have added you to the primary care assistance program ?-If your symptoms worsen or you start having nausea/vomiting and are unable to keep fluids down, please go to the emergency room immediately ?

## 2021-08-27 NOTE — ED Provider Notes (Signed)
?[place] ? ? ? ?CSN: [id number] ?Arrival date & time: 08/27/21  0940 ? ? ?  ? ?History   ?Chief Complaint ?Chief Complaint  ?Patient presents with  ? Dysuria  ? Hematuria  ? ? ?HPI ?Jackson Sullivan is a 61 y.o. male.  ? ?Patient reports 1 week history of right and left flank pain, dysuria, blood in his urine.  He reports the pain is severe at times and makes him vomit, however he denies any current nausea.  He has not been eating much food, however has been able to drink plenty of water.  He is not vomiting up the water.  Patient denies fevers, chills, body aches.  Movement makes the pain worse and the pain comes in spurts at times.  He has not taken anything for his symptoms. ? ?Patient reports longstanding history of kidney stones; many requiring surgery.  He also has concerns about erectile dysfunction and is looking for a primary care provider. ? ? ?Past Medical History:  ?Diagnosis Date  ? Bladder stones   ? Dysuria   ? ED (erectile dysfunction)   ? Frequency of urination   ? Hematuria   ? History of hypertension   ? PER PT 2015  NO LONGER AN ISSUE  ? History of kidney stones   ? Hypertension   ? Nephrolithiasis   ? BILATERAL   ? Rash   ? LEGS   ? ? ?Patient Active Problem List  ? Diagnosis Date Noted  ? Kidney stones 11/16/2015  ? ? ?Past Surgical History:  ?Procedure Laterality Date  ? COLONOSCOPY N/A 05/17/2013  ? Procedure: COLONOSCOPY;  Surgeon: Beryle Beams, MD;  Location: [facility];  Service: Endoscopy;  Laterality: N/A;  ? CYSTOSCOPY W/ RETROGRADES Bilateral 01/26/2016  ? Procedure: CYSTOSCOPY WITH RETROGRADE PYELOGRAM;  Surgeon: Festus Aloe, MD;  Location: [facility];  Service: Urology;  Laterality: Bilateral;  ? CYSTOSCOPY WITH BIOPSY  01/26/2016  ? Procedure: CYSTOSCOPY WITH  URETHRAL BIOPSY;  Surgeon: Festus Aloe, MD;  Location: [facility];  Service: Urology;;  ? CYSTOSCOPY WITH LITHOLAPAXY N/A 01/26/2016  ? Procedure: CYSTOSCOPY WITH  LITHOLAPAXY;  Surgeon: Festus Aloe, MD;  Location: [facility];  Service: Urology;  Laterality: N/A;  ? CYSTOSCOPY WITH LITHOLAPAXY N/A 10/18/2019  ? Procedure: CYSTOSCOPY WITH LITHOLAPAXY, TRANS INCISIONAL EXCISION OF PROSTATE, BLADDER FULGERATION;  Surgeon: Ceasar Mons, MD;  Location: [facility];  Service: Urology;  Laterality: N/A;  ? CYSTOSCOPY WITH STENT PLACEMENT Left 01/26/2016  ? Procedure: CYSTOSCOPY WITH STENT PLACEMENT;  Surgeon: Festus Aloe, MD;  Location: [facility];  Service: Urology;  Laterality: Left;  ? EXTRACORPOREAL SHOCK WAVE LITHOTRIPSY Left 12-14-2015;   03-07-2011  ? HOLMIUM LASER APPLICATION N/A 0/86/5784  ? Procedure: HOLMIUM LASER APPLICATION;  Surgeon: Festus Aloe, MD;  Location: [facility];  Service: Urology;  Laterality: N/A;  ? HOLMIUM LASER APPLICATION  6/96/2952  ? Procedure: HOLMIUM LASER APPLICATION;  Surgeon: Ceasar Mons, MD;  Location: [facility];  Service: Urology;;  ? [place]    in [place]  ? STONE EXTRACTION WITH BASKET Left 01/26/2016  ? Procedure: STONE EXTRACTION WITH BASKET;  Surgeon: Festus Aloe, MD;  Location: [facility];  Service: Urology;  Laterality: Left;  ? URETEROSCOPY Left 01/26/2016  ? Procedure: CYSTOSCOPY WITH  URETEROSCOPY;  Surgeon: Festus Aloe, MD;  Location: [facility];  Service: Urology;  Laterality: Left;  ? URETEROSCOPY  WITH HOLMIUM LASER LITHOTRIPSY Bilateral 10/18/2019  ? Procedure: URETEROSCOPY WITH HOLMIUM LASER LITHOTRIPSY/ STENT PLACEMENT;  Surgeon: Ceasar Mons, MD;  Location: [facility];  Service: Urology;  Laterality: Bilateral;  ? ? ? ? ? ?Home Medications   ? ?Prior to Admission medications   ?Medication Sig Start Date End Date Taking? Authorizing Provider  ?sulfamethoxazole-trimethoprim (BACTRIM DS) 800-160 MG tablet Take 1 tablet by mouth 2 (two)  times daily for 14 days. 08/27/21 09/10/21 Yes Eulogio Bear, NP  ?acetaminophen (TYLENOL) 500 MG tablet Take 500 mg by mouth every 6 (six) hours as needed.    [provider]  ?cyclobenzaprine (FLEXERIL) 10 MG tablet cyclobenzaprine 10 mg tablet ? TAKE 1 TABLET BY MOUTH EVERYDAY AT BEDTIME    [provider]  ?docusate sodium (COLACE) 100 MG capsule Take 1 capsule (100 mg total) by mouth every 12 (twelve) hours. 08/27/21   Eulogio Bear, NP  ?HYDROcodone-acetaminophen (NORCO/VICODIN) 5-325 MG tablet Take 1 tablet by mouth every 6 (six) hours as needed for severe pain. 08/27/21   Eulogio Bear, NP  ?hydrocortisone (ANUSOL-HC) 2.5 % rectal cream Place 1 application rectally 2 (two) times daily. 01/10/21   Jaynee Eagles, PA-C  ?ketoconazole (NIZORAL) 2 % cream Apply 1 application topically daily. 04/01/21   Lorenda Peck, MD  ?meloxicam (MOBIC) 15 MG tablet TAKE 1 TABLET (15 MG TOTAL) BY MOUTH DAILY. 08/09/21   Lorenda Peck, MD  ?miconazole (MICOTIN) 2 % powder Apply topically as needed for itching. Apply twice daily for 1-4 weeks or until resolution of symptoms 03/03/21   Teodora Medici, FNP  ?oxybutynin (DITROPAN) 5 MG tablet Take 1 tablet (5 mg total) by mouth every 8 (eight) hours as needed for bladder spasms. 08/27/21   Eulogio Bear, NP  ?tamsulosin (FLOMAX) 0.4 MG CAPS capsule Take 1 capsule (0.4 mg total) by mouth daily. 08/27/21   Eulogio Bear, NP  ? ? ?Family History ?History reviewed. No pertinent family history. ? ?Social History ?Social History  ? ?Tobacco Use  ? Smoking status: Every Day  ?  Packs/day: 0.25  ?  Years: 25.00  ?  Pack years: 6.25  ?  Types: Cigarettes  ? Smokeless tobacco: Never  ? Tobacco comments:  ?  3-4 CIG PER DAY  ?Vaping Use  ? Vaping Use: Never used  ?Substance Use Topics  ? Alcohol use: No  ?  Alcohol/week: 0.0 standard drinks  ?  Comment: Quit 25 yrs ago  ? Drug use: No  ? ? ? ?Allergies   ?Patient has no known allergies. ? ? ?Review of  Systems ?Review of Systems ?Per HPI ? ?Physical Exam ?Triage Vital Signs ?ED Triage Vitals  ?Enc Vitals Group  ?   BP 08/27/21 1102 (!) 171/117  ?   Pulse Rate 08/27/21 1102 60  ?   Resp 08/27/21 1102 18  ?   Temp 08/27/21 1102 98 ?F (36.7 ?C)  ?   Temp Source 08/27/21 1102 Oral  ?   SpO2 08/27/21 1102 100 %  ?   Weight 08/27/21 1103 175 lb 4.3 oz (79.5 kg)  ?   Height 08/27/21 1103 '5\' 4"'$  (1.626 m)  ?   Head Circumference --   ?   Peak Flow --   ?   Pain Score 08/27/21 1102 10  ?   Pain Loc --   ?   Pain Edu? --   ?   Excl. in [place]? --   ? ?No data  found. ? ?Updated Vital Signs ?BP (!) 171/117   Pulse 60   Temp 98 ?F (36.7 ?C) (Oral)   Resp 18   Ht '5\' 4"'$  (1.626 m)   Wt 175 lb 4.3 oz (79.5 kg)   SpO2 100%   BMI 30.08 kg/m?  ? ?Visual Acuity ?Right Eye Distance:   ?Left Eye Distance:   ?Bilateral Distance:   ? ?Right Eye Near:   ?Left Eye Near:    ?Bilateral Near:    ? ?Physical Exam ?Vitals and nursing note reviewed.  ?Constitutional:   ?   General: He is not in acute distress. ?   Appearance: Normal appearance. He is not ill-appearing or toxic-appearing.  ?HENT:  ?   Head: Normocephalic and atraumatic.  ?Cardiovascular:  ?   Rate and Rhythm: Normal rate and regular rhythm.  ?Pulmonary:  ?   Effort: Pulmonary effort is normal. No respiratory distress.  ?   Breath sounds: Normal breath sounds. No wheezing, rhonchi or rales.  ?Abdominal:  ?   General: Abdomen is flat. Bowel sounds are decreased.  ?   Palpations: Abdomen is soft.  ?   Tenderness: There is generalized abdominal tenderness. There is right CVA tenderness and left CVA tenderness.  ?Musculoskeletal:  ?   Right lower leg: No edema.  ?   Left lower leg: No edema.  ?Skin: ?   General: Skin is warm and dry.  ?   Capillary Refill: Capillary refill takes less than 2 seconds.  ?   Coloration: Skin is not jaundiced or pale.  ?   Findings: No erythema.  ?Neurological:  ?   Mental Status: He is alert and oriented to person, place, and time.  ?   Motor: No  weakness.  ?   Gait: Gait normal.  ?Psychiatric:     ?   Mood and Affect: Mood normal.     ?   Behavior: Behavior normal.     ?   Thought Content: Thought content normal.     ?   Judgment: Judgment normal.  ? ? ? ?UC Treatmen

## 2021-08-29 LAB — URINE CULTURE: Culture: NO GROWTH

## 2022-10-12 ENCOUNTER — Ambulatory Visit
Admission: EM | Admit: 2022-10-12 | Discharge: 2022-10-12 | Disposition: A | Discharge status: Home / Self Care | Payer: BC Managed Care – PPO | Attending: Nurse Practitioner | Admitting: Nurse Practitioner

## 2022-10-12 DIAGNOSIS — N200 Calculus of kidney: Secondary | ICD-10-CM

## 2022-10-12 DIAGNOSIS — R030 Elevated blood-pressure reading, without diagnosis of hypertension: Secondary | ICD-10-CM

## 2022-10-12 DIAGNOSIS — R319 Hematuria, unspecified: Secondary | ICD-10-CM

## 2022-10-12 DIAGNOSIS — R109 Unspecified abdominal pain: Secondary | ICD-10-CM

## 2022-10-12 LAB — POCT URINALYSIS DIP (MANUAL ENTRY)
Glucose, UA: NEGATIVE mg/dL
Nitrite, UA: NEGATIVE
Protein Ur, POC: 100 mg/dL — AB
Spec Grav, UA: >=1.03 — AB (ref 1.010–1.025)
Urobilinogen, UA: 1 E.U./dL
pH, UA: 6 (ref 5.0–8.0)

## 2022-10-12 MED ORDER — HYDROCODONE-ACETAMINOPHEN 5-325 MG PO TABS: 1.0000 | ORAL_TABLET | ORAL | 0 refills | Status: DC | PRN

## 2022-10-12 MED ORDER — TAMSULOSIN HCL 0.4 MG PO CAPS: 0.4000 mg | ORAL_CAPSULE | Freq: Every day | ORAL | 0 refills | Status: DC

## 2022-10-12 MED ORDER — PHENAZOPYRIDINE HCL 200 MG PO TABS: 200.0000 mg | ORAL_TABLET | Freq: Three times a day (TID) | ORAL | 0 refills | Status: DC

## 2022-10-12 NOTE — Discharge Instructions (Addendum)
You have been seen today for possible kidney stone.   Take medications as prescribed.   Drink plenty of fluids.   Use the strainer every time you pee   If you catch a stone, Do not throw out the stone. Keep it so that it can be tested by your doctor. Put the stone in the sample cup you were given.   Follow-up with a urologist. Call as soon as possible to arrange appointment.   Go to the ED immediately if:  You have a fever or chills. You get very bad pain. You get new pain in your belly. You faint. You cannot pee.  Your blood pressure was noted to be elevated today. Managing hypertension is very important. Over time, hypertension can damage the arteries and decrease blood flow to parts of the body, including the brain, heart, and kidneys. Having untreated or uncontrolled hypertension can lead to heart attack, stroke, weakened blood vessels (aneurysm), heart failure, kidney damage, eye damage, memory/concentration problems and vascular dementia. Please monitor your blood pressure closely. Go to the ED immediately if you develop a severe headache, dizziness, sudden vision problems, confusion, experience unusual weakness or numbness, severe pain in your chest or abdomen, you vomit repeatedly or have trouble breathing.

## 2022-10-12 NOTE — ED Triage Notes (Signed)
Pt present urinary frequency with flank pain. Symptoms started a week ago.

## 2022-10-12 NOTE — ED Provider Notes (Signed)
[facility]    CSN: [id number] Arrival date & time: 10/12/22  0272      History   Chief Complaint Chief Complaint  Patient presents with   Flank Pain    HPI Jackson Sullivan is a 62 y.o. male.    Jackson Sullivan is a 62 y.o. male that presents with left flank pain and right groin pain. Onset of symptoms was about 1.5 weeks ago with gradually worsening course since that time. Patient also reports bladder spasms, dysuria, hematuria, genital discomfort and nausea. He denies any fevers, chills, myalgias, vomiting, abdominal pain or penile discharge. Patient reports a long standing history of kidney stones over the past 25 years. Has had previous ureteroscopy, lithotripsy, cystoscopy and litholapaxy. He reports not having a stone in the past few years though. His current symptoms are similar to prior kidney stones.        Past Medical History:  Diagnosis Date   Bladder stones    Dysuria    ED (erectile dysfunction)    Frequency of urination    Hematuria    History of hypertension    PER PT 2015  NO LONGER AN ISSUE   History of kidney stones    Hypertension    Nephrolithiasis    BILATERAL    Rash    LEGS     Patient Active Problem List   Diagnosis Date Noted   Kidney stones 11/16/2015    Past Surgical History:  Procedure Laterality Date   COLONOSCOPY N/A 05/17/2013   Procedure: COLONOSCOPY;  Surgeon: Theda Belfast, MD;  Location: [facility];  Service: Endoscopy;  Laterality: N/A;   CYSTOSCOPY W/ RETROGRADES Bilateral 01/26/2016   Procedure: CYSTOSCOPY WITH RETROGRADE PYELOGRAM;  Surgeon: Jerilee Field, MD;  Location[facility];  Service: Urology;  Laterality: Bilateral;   CYSTOSCOPY WITH BIOPSY  01/26/2016   Procedure: CYSTOSCOPY WITH  URETHRAL BIOPSY;  Surgeon: Jerilee Field, MD;  Location: [facility];  Service: Urology;;   CYSTOSCOPY WITH LITHOLAPAXY N/A 01/26/2016   Procedure: CYSTOSCOPY WITH LITHOLAPAXY;   Surgeon: Jerilee Field, MD;  Location: [facility];  Service: Urology;  Laterality: N/A;   CYSTOSCOPY WITH LITHOLAPAXY N/A 10/18/2019   Procedure: CYSTOSCOPY WITH LITHOLAPAXY, TRANS INCISIONAL EXCISION OF PROSTATE, BLADDER FULGERATION;  Surgeon: Rene Paci, MD;  Location: [facility];  Service: Urology;  Laterality: N/A;   CYSTOSCOPY WITH STENT PLACEMENT Left 01/26/2016   Procedure: CYSTOSCOPY WITH STENT PLACEMENT;  Surgeon: Jerilee Field, MD;  Location: [facility];  Service: Urology;  Laterality: Left;   EXTRACORPOREAL SHOCK WAVE LITHOTRIPSY Left 12-14-2015;   03-07-2011   HOLMIUM LASER APPLICATION N/A 01/26/2016   Procedure: HOLMIUM LASER APPLICATION;  Surgeon: Jerilee Field, MD;  Location: [facility];  Service: Urology;  Laterality: N/A;   HOLMIUM LASER APPLICATION  10/18/2019   Procedure: HOLMIUM LASER APPLICATION;  Surgeon: Rene Paci, MD;  Location: [facility];  Service: Urology;;   ORCHIECTOMY  1995    in [place]   STONE EXTRACTION WITH BASKET Left 01/26/2016   Procedure: STONE EXTRACTION WITH BASKET;  Surgeon: Jerilee Field, MD;  Location: [facility];  Service: Urology;  Laterality: Left;   URETEROSCOPY Left 01/26/2016   Procedure: CYSTOSCOPY WITH  URETEROSCOPY;  Surgeon: Jerilee Field, MD;  Location: [facility];  Service: Urology;  Laterality: Left;   URETEROSCOPY WITH HOLMIUM LASER LITHOTRIPSY Bilateral 10/18/2019   Procedure: URETEROSCOPY WITH HOLMIUM LASER LITHOTRIPSY/  STENT PLACEMENT;  Surgeon: Rene Paci, MD;  Location: [facility];  Service: Urology;  Laterality: Bilateral;       Home Medications    Prior to Admission medications   Medication Sig Start Date End Date Taking? Authorizing Provider  phenazopyridine (PYRIDIUM) 200 MG tablet Take 1 tablet (200 mg total) by mouth 3 (three) times daily. 10/12/22   Yes Lurline Idol, FNP  tamsulosin (FLOMAX) 0.4 MG CAPS capsule Take 1 capsule (0.4 mg total) by mouth daily. 10/12/22  Yes Lurline Idol, FNP  HYDROcodone-acetaminophen (NORCO/VICODIN) 5-325 MG tablet Take 1 tablet by mouth every 4 (four) hours as needed for severe pain. 10/12/22   Lurline Idol, FNP    Family History History reviewed. No pertinent family history.  Social History Social History   Tobacco Use   Smoking status: Every Day    Packs/day: 0.25    Years: 25.00    Additional pack years: 0.00    Total pack years: 6.25    Types: Cigarettes   Smokeless tobacco: Never   Tobacco comments:    3-4 CIG PER DAY  Vaping Use   Vaping Use: Never used  Substance Use Topics   Alcohol use: No    Alcohol/week: 0.0 standard drinks of alcohol    Comment: Quit 25 yrs ago   Drug use: No     Allergies   Patient has no known allergies.   Review of Systems Review of Systems  Constitutional:  Negative for chills, diaphoresis and fever.  Gastrointestinal:  Positive for nausea. Negative for abdominal pain and vomiting.  Genitourinary:  Positive for dysuria, flank pain and hematuria. Negative for penile discharge.  Musculoskeletal:  Negative for back pain.  All other systems reviewed and are negative.    Physical Exam Triage Vital Signs ED Triage Vitals  Enc Vitals Group     BP 10/12/22 1100 (!) 170/91     Pulse Rate 10/12/22 1100 66     Resp 10/12/22 1100 18     Temp 10/12/22 1100 97.9 F (36.6 C)     Temp Source 10/12/22 1100 Oral     SpO2 10/12/22 1100 96 %     Weight --      Height --      Head Circumference --      Peak Flow --      Pain Score 10/12/22 1101 10     Pain Loc --      Pain Edu? --      Excl. in [place]? --    No data found.  Updated Vital Signs BP (!) 170/91 (BP Location: Left Arm)   Pulse 66   Temp 97.9 F (36.6 C) (Oral)   Resp 18   SpO2 96%   Visual Acuity Right Eye Distance:   Left Eye Distance:   Bilateral Distance:    Right  Eye Near:   Left Eye Near:    Bilateral Near:     Physical Exam Vitals and nursing note reviewed.  Constitutional:      General: He is not in acute distress.    Appearance: Normal appearance. He is not ill-appearing, toxic-appearing or diaphoretic.  HENT:     Head: Normocephalic.  Cardiovascular:     Rate and Rhythm: Normal rate.  Pulmonary:     Effort: Pulmonary effort is normal.  Abdominal:     General: Bowel sounds are normal. There is no distension.     Palpations: Abdomen is soft.     Tenderness: There  is no abdominal tenderness. There is no right CVA tenderness or left CVA tenderness.  Genitourinary:    Comments: Deferred  Musculoskeletal:        General: Normal range of motion.     Cervical back: Normal range of motion and neck supple.  Skin:    General: Skin is warm and dry.  Neurological:     General: No focal deficit present.     Mental Status: He is alert and oriented to person, place, and time.      UC Treatments / Results  Labs (all labs ordered are listed, but only abnormal results are displayed) Labs Reviewed  POCT URINALYSIS DIP (MANUAL ENTRY) - Abnormal; Notable for the following components:      Result Value   Clarity, UA cloudy (*)    Bilirubin, UA small (*)    Ketones, POC UA trace (5) (*)    Spec Grav, UA >=1.030 (*)    Blood, UA large (*)    Protein Ur, POC =100 (*)    Leukocytes, UA Trace (*)    All other components within normal limits    EKG   Radiology No results found.  Procedures Procedures (including critical care time)  Medications Ordered in UC Medications - No data to display  Initial Impression / Assessment and Plan / UC Course  I have reviewed the triage vital signs and the nursing notes.  Pertinent labs & imaging results that were available during my care of the patient were reviewed by me and considered in my medical decision making (see chart for details).    62 year old male presenting with left flank pain, right  groin pain, bladder spasms, dysuria, hematuria, genital discomfort and nausea.  Has a longstanding history of kidney stones with previous urology evaluation as well as ureteroscopy, lithotripsy, cystoscopy and litholapaxy.  Reports that his current symptoms are similar to prior episodes of renal stones.  Patient is afebrile and nontoxic.  Physical exam as above.  He is also noted to be hypertensive at 170/91.  No evidence of hypertensive urgency or hypertensive emergency.  Patient denies history of hypertension.  Pyridium, Flomax and Norco ordered.  Patient instructed to strain all urine, fluids and follow-up with urology.  He was also instructed to monitor blood pressure closely.  ED precautions extensively reviewed.  Today's evaluation has revealed no signs of a dangerous process. Discussed diagnosis with patient and/or guardian. Patient and/or guardian aware of their diagnosis, possible red flag symptoms to watch out for and need for close follow up. Patient and/or guardian understands verbal and written discharge instructions. Patient and/or guardian comfortable with plan and disposition.  Patient and/or guardian has a clear mental status at this time, good insight into illness (after discussion and teaching) and has clear judgment to make decisions regarding their care  Documentation was completed with the aid of voice recognition software. Transcription may contain typographical errors. Final Clinical Impressions(s) / UC Diagnoses   Final diagnoses:  Hematuria, unspecified type  Acute left flank pain  Kidney stone  Elevated blood pressure reading in office without diagnosis of hypertension     Discharge Instructions      You have been seen today for possible kidney stone.   Take medications as prescribed.   Drink plenty of fluids.   Use the strainer every time you pee   If you catch a stone, Do not throw out the stone. Keep it so that it can be tested by your doctor. Put the stone in  the sample cup you were given.   Follow-up with a urologist. Call as soon as possible to arrange appointment.   Go to the ED immediately if:  You have a fever or chills. You get very bad pain. You get new pain in your belly. You faint. You cannot pee.  Your blood pressure was noted to be elevated today. Managing hypertension is very important. Over time, hypertension can damage the arteries and decrease blood flow to parts of the body, including the brain, heart, and kidneys. Having untreated or uncontrolled hypertension can lead to heart attack, stroke, weakened blood vessels (aneurysm), heart failure, kidney damage, eye damage, memory/concentration problems and vascular dementia. Please monitor your blood pressure closely. Go to the ED immediately if you develop a severe headache, dizziness, sudden vision problems, confusion, experience unusual weakness or numbness, severe pain in your chest or abdomen, you vomit repeatedly or have trouble breathing.     ED Prescriptions     Medication Sig Dispense Auth. Provider   tamsulosin (FLOMAX) 0.4 MG CAPS capsule Take 1 capsule (0.4 mg total) by mouth daily. 14 capsule Lurline Idol, FNP   phenazopyridine (PYRIDIUM) 200 MG tablet Take 1 tablet (200 mg total) by mouth 3 (three) times daily. 6 tablet Lurline Idol, FNP   HYDROcodone-acetaminophen (NORCO/VICODIN) 5-325 MG tablet  (Status: Discontinued) Take 1 tablet by mouth every 4 (four) hours as needed for severe pain. 10 tablet Lurline Idol, FNP   HYDROcodone-acetaminophen (NORCO/VICODIN) 5-325 MG tablet Take 1 tablet by mouth every 4 (four) hours as needed for severe pain. 10 tablet Lurline Idol, FNP      I have reviewed the PDMP during this encounter.   Lurline Idol, [place] 10/12/22 1216

## 2022-11-10 NOTE — Progress Notes (Signed)
 Subjective Patient ID: Jackson Sullivan is a 62 y.o. male.  Chief Complaint  Patient presents with  . 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 He tried advil and icy hot. Lower right sided back pain with pain shooting down right leg and pain in right hand also.     The following information was reviewed by members of the visit team:  Allergies  Meds  Med Hx  Surg Hx      Back Pain   62 year old male presents to clinic for treatment and evaluation of right-sided lower back pain that radiates down the right leg.  Patient does report history of sciatica and this issue previously.  States that he was twisting and loading a tray for of food table when he noticed the pain about 2 days ago.  He denies any other history of injury, trauma, surgery to the back.  He states that he has tried some Advil and IcyHot with little relief.  He reports that the pain shoots down the back of the right leg.  He does report a history of kidney stones and knows he has a stone in his bladder that is stuck which he is followed by with urology.  Patient denies any fevers, chills, other injuries, chest pain or shortness of breath, abdominal pain, nausea, vomiting, diarrhea, rashes.  Does report has had hydrocodone  in the past for the kidney stones and will occasionally take this.  Has not taken any recently.  Patient also requesting referral for PCP.  Any weakness to extremities, saddle paresthesias, loss of bowel or bladder control. Review of Systems  Constitutional: Negative.   HENT: Negative.    Respiratory: Negative.    Cardiovascular: Negative.   Gastrointestinal: Negative.   Genitourinary: Negative.   Musculoskeletal:  Positive for back pain.  Skin: Negative.   Neurological: Negative.     Objective BP (!) 197/93   Pulse 74   Temp 98.1 F (36.7 C) (Tympanic)   Resp 16   SpO2 100%   Physical Exam Vitals and  nursing note reviewed.  Constitutional:      General: He is not in acute distress.    Appearance: Normal appearance. He is not ill-appearing, toxic-appearing or diaphoretic.  HENT:     Head: Normocephalic and atraumatic.     Right Ear: External ear normal.     Left Ear: External ear normal.     Nose: Nose normal.     Mouth/Throat:     Mouth: Mucous membranes are moist.  Eyes:     Conjunctiva/sclera: Conjunctivae normal.     Pupils: Pupils are equal, round, and reactive to light.  Cardiovascular:     Rate and Rhythm: Normal rate and regular rhythm.     Pulses: Normal pulses.     Heart sounds: Normal heart sounds.  Pulmonary:     Effort: Pulmonary effort is normal.     Breath sounds: Normal breath sounds.  Musculoskeletal:     Cervical back: Normal range of motion and neck supple.     Comments: L-spine: Nontender to palpation midline of L-spine.  Mild tenderness to palpation about paraspinal musculature as well as the superior aspect of the gluteus muscle on the right side.  Strength is intact, sensation intact, distal pulses intact, capillary refill less than 2 seconds.  Straight leg positive right lower extremity.  Skin:    General: Skin is warm and dry.  Capillary Refill: Capillary refill takes less than 2 seconds.     Findings: No erythema or rash.  Neurological:     General: No focal deficit present.     Mental Status: He is alert and oriented to person, place, and time.  Psychiatric:        Mood and Affect: Mood normal.        Behavior: Behavior normal.     Assessment/Plan Diagnoses and all orders for this visit:  Acute right-sided low back pain with right-sided sciatica -     naproxen  (NAPROSYN ) 500 mg tablet; Take 1 tablet (500 mg total) by mouth in the morning and 1 tablet (500 mg total) in the evening. Take with meals. -     cyclobenzaprine  (FLEXERIL ) 5 mg tablet; Take 1 tablet (5 mg total) by mouth nightly as needed for muscle spasms for up to 10 days. -      Ambulatory referral to [facility]; Future  Other orders -     aspirin 81 mg cap; Aspirin 81 mg  Discussed utility of plain films, patient declined at this time as he has not had a direct injury to the back.  Suspect sciatica/muscular involvement of the right lower back rating pain.  Naproxen  and Flexeril  sent to pharmacy on file.  Discussed gentle range of motion exercises, heat, ice.  Did discuss Toradol  injection today however patient declined.  Primary care referral made.  Patient has urologist for his kidney stone issue and will follow-up with them for this.  Discussed strict return and ED precautions.  Patient to follow-up with PCP.   Patient has been instructed on prescription and/or over-the-counter medications, dosages, side effects, and possible interactions as associated with each diagnosis in my impression and plan above.  2.  I have educated patient on supportive care therapy associated with the diagnoses above.  3.  Patient was instructed on when to follow up and know that they can follow up here, with their PCP, Urgent Care, ED.  They have been instructed that if symptoms worse that should return to the clinic, go to the nearest ED, or activate EMS.  4.  Red Flags associated with their diagnoses were reviewed and patient was educated on what to do if red flags develop.  5.  Patient agreed with plan and verbalized understanding.    If labs were completed today, your lab(s) have been sent to our main campus laboratory.  Based on the testing ordered and lab volume it can take up to 3-5 days for results to return. We will call you with any results that require attention.  We do not call negative/normal results.  All results will be available in your [facility] account.   Discussed treatment plan with patient.  Patient verbalized understanding and had no questions upon discharge.  Patient stable and in NAD distress upon discharge from clinic.  Contact clinic with any questions  or concerns.  Portions of this note have been dictated using Dragon voice to text software. Errors may be present despite proofreading.     Electronically signed: Lauraine Dionne Sharps, PA-C 11/10/2022  11:04 AM

## 2024-02-02 ENCOUNTER — Inpatient Hospital Stay (HOSPITAL_COMMUNITY)
Admission: EM | Admit: 2024-02-02 | Discharge: 2024-02-05 | DRG: 378 | Disposition: A | Discharge status: Home / Self Care | Payer: MEDICAID | Source: Ambulatory Visit | Attending: Internal Medicine | Admitting: Internal Medicine

## 2024-02-02 ENCOUNTER — Encounter (HOSPITAL_COMMUNITY): Payer: Self-pay | Admitting: Emergency Medicine

## 2024-02-02 ENCOUNTER — Inpatient Hospital Stay (HOSPITAL_COMMUNITY): Payer: MEDICAID

## 2024-02-02 ENCOUNTER — Other Ambulatory Visit: Payer: Self-pay

## 2024-02-02 ENCOUNTER — Emergency Department (HOSPITAL_COMMUNITY): Payer: MEDICAID

## 2024-02-02 DIAGNOSIS — Z7982 Long term (current) use of aspirin: Secondary | ICD-10-CM

## 2024-02-02 DIAGNOSIS — K922 Gastrointestinal hemorrhage, unspecified: Principal | ICD-10-CM

## 2024-02-02 DIAGNOSIS — D509 Iron deficiency anemia, unspecified: Secondary | ICD-10-CM | POA: Diagnosis present

## 2024-02-02 DIAGNOSIS — K449 Diaphragmatic hernia without obstruction or gangrene: Secondary | ICD-10-CM | POA: Diagnosis present

## 2024-02-02 DIAGNOSIS — K5521 Angiodysplasia of colon with hemorrhage: Principal | ICD-10-CM | POA: Diagnosis present

## 2024-02-02 DIAGNOSIS — R300 Dysuria: Secondary | ICD-10-CM | POA: Diagnosis not present

## 2024-02-02 DIAGNOSIS — T39395A Adverse effect of other nonsteroidal anti-inflammatory drugs [NSAID], initial encounter: Secondary | ICD-10-CM | POA: Diagnosis present

## 2024-02-02 DIAGNOSIS — D62 Acute posthemorrhagic anemia: Secondary | ICD-10-CM | POA: Diagnosis present

## 2024-02-02 DIAGNOSIS — D649 Anemia, unspecified: Secondary | ICD-10-CM

## 2024-02-02 DIAGNOSIS — Z87442 Personal history of urinary calculi: Secondary | ICD-10-CM

## 2024-02-02 DIAGNOSIS — N210 Calculus in bladder: Secondary | ICD-10-CM | POA: Insufficient documentation

## 2024-02-02 DIAGNOSIS — K921 Melena: Secondary | ICD-10-CM

## 2024-02-02 DIAGNOSIS — Z79899 Other long term (current) drug therapy: Secondary | ICD-10-CM

## 2024-02-02 DIAGNOSIS — Z8669 Personal history of other diseases of the nervous system and sense organs: Secondary | ICD-10-CM

## 2024-02-02 DIAGNOSIS — K254 Chronic or unspecified gastric ulcer with hemorrhage: Secondary | ICD-10-CM | POA: Diagnosis present

## 2024-02-02 DIAGNOSIS — I10 Essential (primary) hypertension: Secondary | ICD-10-CM | POA: Diagnosis present

## 2024-02-02 DIAGNOSIS — F17210 Nicotine dependence, cigarettes, uncomplicated: Secondary | ICD-10-CM | POA: Diagnosis present

## 2024-02-02 DIAGNOSIS — I959 Hypotension, unspecified: Secondary | ICD-10-CM | POA: Diagnosis present

## 2024-02-02 DIAGNOSIS — Y636 Underdosing and nonadministration of necessary drug, medicament or biological substance: Secondary | ICD-10-CM | POA: Diagnosis present

## 2024-02-02 LAB — COMPREHENSIVE METABOLIC PANEL WITH GFR
ALT: 10 U/L (ref 0–44)
AST: 25 U/L (ref 15–41)
Albumin: 3.7 g/dL (ref 3.5–5.0)
Alkaline Phosphatase: 69 U/L (ref 38–126)
Anion gap: 12 (ref 5–15)
BUN: 17 mg/dL (ref 8–23)
CO2: 21 mmol/L — ABNORMAL LOW (ref 22–32)
Calcium: 8.5 mg/dL — ABNORMAL LOW (ref 8.9–10.3)
Chloride: 110 mmol/L (ref 98–111)
Creatinine, Ser: 1.2 mg/dL (ref 0.61–1.24)
GFR, Estimated: >60 mL/min (ref >60)
Glucose, Bld: 110 mg/dL — ABNORMAL HIGH (ref 70–99)
Potassium: 3.8 mmol/L (ref 3.5–5.1)
Sodium: 143 mmol/L (ref 135–145)
Total Bilirubin: 0.2 mg/dL (ref 0.0–1.2)
Total Protein: 5.9 g/dL — ABNORMAL LOW (ref 6.5–8.1)

## 2024-02-02 LAB — CBC WITH DIFFERENTIAL/PLATELET
Abs Immature Granulocytes: 0.02 K/uL (ref 0.00–0.07)
Basophils Absolute: 0 K/uL (ref 0.0–0.1)
Basophils Relative: 1 %
Eosinophils Absolute: 0.2 K/uL (ref 0.0–0.5)
Eosinophils Relative: 3 %
HCT: 16.3 % — ABNORMAL LOW (ref 39.0–52.0)
Hemoglobin: 5.1 g/dL — CL (ref 13.0–17.0)
Immature Granulocytes: 0 %
Lymphocytes Relative: 33 %
Lymphs Abs: 2.5 K/uL (ref 0.7–4.0)
MCH: 29 pg (ref 26.0–34.0)
MCHC: 31.3 g/dL (ref 30.0–36.0)
MCV: 92.6 fL (ref 80.0–100.0)
Monocytes Absolute: 0.7 K/uL (ref 0.1–1.0)
Monocytes Relative: 9 %
Neutro Abs: 4.2 K/uL (ref 1.7–7.7)
Neutrophils Relative %: 54 %
Platelets: 302 K/uL (ref 150–400)
RBC: 1.76 MIL/uL — ABNORMAL LOW (ref 4.22–5.81)
RDW: 13.9 % (ref 11.5–15.5)
WBC: 7.7 K/uL (ref 4.0–10.5)
nRBC: 0 % (ref 0.0–0.2)

## 2024-02-02 LAB — PREPARE RBC (CROSSMATCH)

## 2024-02-02 LAB — PROTIME-INR
INR: 0.9 (ref 0.8–1.2)
Prothrombin Time: 13 s (ref 11.4–15.2)

## 2024-02-02 LAB — ABO/RH: ABO/RH(D): A POS

## 2024-02-02 MED ORDER — PANTOPRAZOLE SODIUM 40 MG IV SOLR
80.0000 mg | INTRAVENOUS | Status: AC
  Administered 2024-02-03: 80 mg via INTRAVENOUS
  Filled 2024-02-02: qty 20

## 2024-02-02 MED ORDER — ONDANSETRON HCL 4 MG/2ML IJ SOLN: 4.0000 mg | Freq: Four times a day (QID) | INTRAMUSCULAR | Status: DC | PRN

## 2024-02-02 MED ORDER — SODIUM CHLORIDE 0.9% FLUSH: 3.0000 mL | INTRAVENOUS | Status: DC | PRN

## 2024-02-02 MED ORDER — ONDANSETRON HCL 4 MG PO TABS: 4.0000 mg | ORAL_TABLET | Freq: Four times a day (QID) | ORAL | Status: DC | PRN

## 2024-02-02 MED ORDER — SODIUM CHLORIDE 0.9 % IV SOLN: 250.0000 mL | INTRAVENOUS | Status: AC | PRN

## 2024-02-02 MED ORDER — IOHEXOL 350 MG/ML SOLN
100.0000 mL | Freq: Once | INTRAVENOUS | Status: AC | PRN
  Administered 2024-02-02: 100 mL via INTRAVENOUS

## 2024-02-02 MED ORDER — ACETAMINOPHEN 650 MG RE SUPP: 650.0000 mg | Freq: Four times a day (QID) | RECTAL | Status: DC | PRN

## 2024-02-02 MED ORDER — LACTATED RINGERS IV SOLN: INTRAVENOUS | Status: AC

## 2024-02-02 MED ORDER — ACETAMINOPHEN 325 MG PO TABS
650.0000 mg | ORAL_TABLET | Freq: Four times a day (QID) | ORAL | Status: DC | PRN
  Administered 2024-02-04 (×2): 650 mg via ORAL
  Filled 2024-02-02 (×2): qty 2

## 2024-02-02 MED ORDER — TAMSULOSIN HCL 0.4 MG PO CAPS: 0.4000 mg | ORAL_CAPSULE | Freq: Every day | ORAL | Status: DC

## 2024-02-02 MED ORDER — SODIUM CHLORIDE 0.9% IV SOLUTION: Freq: Once | INTRAVENOUS | Status: AC

## 2024-02-02 MED ORDER — PANTOPRAZOLE SODIUM 40 MG IV SOLR
40.0000 mg | Freq: Two times a day (BID) | INTRAVENOUS | Status: DC
  Administered 2024-02-03 – 2024-02-05 (×5): 40 mg via INTRAVENOUS
  Filled 2024-02-02 (×5): qty 10

## 2024-02-02 MED ORDER — PANTOPRAZOLE SODIUM 40 MG IV SOLR
80.0000 mg | Freq: Once | INTRAVENOUS | Status: AC
  Administered 2024-02-02: 80 mg via INTRAVENOUS
  Filled 2024-02-02: qty 20

## 2024-02-02 MED ORDER — SODIUM CHLORIDE 0.9% FLUSH
3.0000 mL | Freq: Two times a day (BID) | INTRAVENOUS | Status: DC
  Administered 2024-02-03 – 2024-02-05 (×5): 3 mL via INTRAVENOUS

## 2024-02-02 NOTE — H&P (Signed)
 History and Physical    Jackson Sullivan FMW:[id number] DOB: 03-18-1961 DOA: 02/02/2024  PCP: Patient, No Pcp Per   Patient coming from: Home   Chief Complaint:  Chief Complaint  Patient presents with   Rectal Bleeding   Low Hemoglobin   ED TRIAGE note:Patient c/o GI bleeding x 5 days. Patient report PCP recommended to come to ED d/t Hgb 5.6.  Patient report blurred vision and dizziness. Patient report nausea denies vomiting.  Patient denies SOB.   HPI:  Jackson Sullivan is a 63 y.o. male with medical history significant of history of sciatica, bladder stone with chronic intermittent hematuria, and essential hypertension presented to emergency department for evaluation for dark-colored stool for 1 week.  Patient went to see primary care clinic found to have guaiac positive and hemoglobin 5.6.  Patient reported previously that he did not have any history of GI bleed. Patient has no previous history of EGD and colonoscopy.  Denies any abdominal pain and cramping.  He takes aspirin 81 mg daily and Aleve /naproxen  as needed for joint pain/osteoarthritis.  Patient denies any headache, dizziness, shortness of breath, palpitation, hemoptysis, hematemesis and chest pain.   ED Course:  At presentation to ED patient found hypertensive blood pressure 192/93 which improved to 143/88.  Hemodynamically stable.  Afebrile. CBC showing low hemoglobin 5.1, low hematocrit. CMP shows low bicarb 21 otherwise unremarkable. In the ED troponin screen has been done.  Chest x-ray no active disease process.  In the ED 2 units of blood transfusion has been ordered.  Dr. Laurice reported that patient is stable and currently does not have any active GI bleed.  ED physician consulted [facility] Dr. Rollin, as patient is hemodynamically  will evaluate patient in the morning.  Hospitalist has been consulted for further evaluation management of acute anemia secondary to GI bleed.  Significant labs in the ED: Lab  Orders         Urine Culture         Comprehensive metabolic panel         CBC with Differential         Protime-INR         Urinalysis, w/ Reflex to Culture (Infection Suspected) -Urine, Clean Catch         HIV Antibody (routine testing w rflx)         Comprehensive metabolic panel         CBC         Protime-INR         Hemoglobin and hematocrit, blood       Review of Systems:  Review of Systems  Constitutional:  Negative for chills, fever, malaise/fatigue and weight loss.  Cardiovascular:  Negative for chest pain, palpitations, orthopnea and leg swelling.  Gastrointestinal:  Positive for melena. Negative for abdominal pain, blood in stool, constipation, diarrhea, heartburn, nausea and vomiting.  Genitourinary:  Negative for dysuria, flank pain, frequency, hematuria and urgency.  Musculoskeletal:  Negative for myalgias.  Neurological:  Negative for dizziness and headaches.  Psychiatric/Behavioral:  The patient is not nervous/anxious.     Past Medical History:  Diagnosis Date   Bladder stones    Dysuria    ED (erectile dysfunction)    Frequency of urination    Hematuria    History of hypertension    PER PT 2015  NO LONGER AN ISSUE   History of kidney stones    Hypertension    Nephrolithiasis    BILATERAL  Rash    LEGS     Past Surgical History:  Procedure Laterality Date   COLONOSCOPY N/A 05/17/2013   Procedure: COLONOSCOPY;  Surgeon: Belvie JONETTA Just, MD;  Location: [facility];  Service: Endoscopy;  Laterality: N/A;   CYSTOSCOPY W/ RETROGRADES Bilateral 01/26/2016   Procedure: CYSTOSCOPY WITH RETROGRADE PYELOGRAM;  Surgeon: Donnice Brooks, MD;  Location: [facility];  Service: Urology;  Laterality: Bilateral;   CYSTOSCOPY WITH BIOPSY  01/26/2016   Procedure: CYSTOSCOPY WITH  URETHRAL BIOPSY;  Surgeon: Donnice Brooks, MD;  Location: [facility];  Service: Urology;;   CYSTOSCOPY WITH LITHOLAPAXY N/A 01/26/2016   Procedure: CYSTOSCOPY  WITH LITHOLAPAXY;  Surgeon: Donnice Brooks, MD;  Location: [facility];  Service: Urology;  Laterality: N/A;   CYSTOSCOPY WITH LITHOLAPAXY N/A 10/18/2019   Procedure: CYSTOSCOPY WITH LITHOLAPAXY, TRANS INCISIONAL EXCISION OF PROSTATE, BLADDER FULGERATION;  Surgeon: Devere Lonni Righter, MD;  Location: [facility];  Service: Urology;  Laterality: N/A;   CYSTOSCOPY WITH STENT PLACEMENT Left 01/26/2016   Procedure: CYSTOSCOPY WITH STENT PLACEMENT;  Surgeon: Donnice Brooks, MD;  Location: [facility];  Service: Urology;  Laterality: Left;   EXTRACORPOREAL SHOCK WAVE LITHOTRIPSY Left 12-14-2015;   03-07-2011   HOLMIUM LASER APPLICATION N/A 01/26/2016   Procedure: HOLMIUM LASER APPLICATION;  Surgeon: Donnice Brooks, MD;  Location: [facility];  Service: Urology;  Laterality: N/A;   HOLMIUM LASER APPLICATION  10/18/2019   Procedure: HOLMIUM LASER APPLICATION;  Surgeon: Devere Lonni Righter, MD;  Location: [facility];  Service: Urology;;   ORCHIECTOMY  1995    in [place]   STONE EXTRACTION WITH BASKET Left 01/26/2016   Procedure: STONE EXTRACTION WITH BASKET;  Surgeon: Donnice Brooks, MD;  Location: [facility];  Service: Urology;  Laterality: Left;   URETEROSCOPY Left 01/26/2016   Procedure: CYSTOSCOPY WITH  URETEROSCOPY;  Surgeon: Donnice Brooks, MD;  Location: [facility];  Service: Urology;  Laterality: Left;   URETEROSCOPY WITH HOLMIUM LASER LITHOTRIPSY Bilateral 10/18/2019   Procedure: URETEROSCOPY WITH HOLMIUM LASER LITHOTRIPSY/ STENT PLACEMENT;  Surgeon: Devere Lonni Righter, MD;  Location: [facility];  Service: Urology;  Laterality: Bilateral;     reports that he has been smoking cigarettes. He has a 6.3 pack-year smoking history. He has never used smokeless tobacco. He reports that he does not drink alcohol and does not use drugs.  No Known  Allergies  History reviewed. No pertinent family history.  Prior to Admission medications   Medication Sig Start Date End Date Taking? Authorizing Provider  aspirin EC 81 MG tablet Take 81 mg by mouth daily. Swallow whole.   Yes [provider]  naproxen  sodium (ALEVE ) 220 MG tablet Take 220 mg by mouth daily as needed.   Yes [provider]  HYDROcodone -acetaminophen  (NORCO/VICODIN) 5-325 MG tablet Take 1 tablet by mouth every 4 (four) hours as needed for severe pain. Patient not taking: Reported on 02/02/2024 10/12/22   Murrill, Samantha, FNP  phenazopyridine  (PYRIDIUM ) 200 MG tablet Take 1 tablet (200 mg total) by mouth 3 (three) times daily. Patient not taking: Reported on 02/02/2024 10/12/22   Iola Lukes, FNP  tamsulosin  (FLOMAX ) 0.4 MG CAPS capsule Take 1 capsule (0.4 mg total) by mouth daily. Patient not taking: Reported on 02/02/2024 10/12/22   Iola Lukes, FNP     Physical Exam: Vitals:   02/03/24 0226 02/03/24 0242 02/03/24 0330 02/03/24 0439  BP: (!) 169/84 (!) 148/81 (!) 146/88 ROLLEN)  171/90  Pulse: 76 78 90 89  Resp: (!) 23 (!) 21 19 18   Temp: 98 F (36.7 C) 98.3 F (36.8 C)  98.2 F (36.8 C)  TempSrc: Oral Oral    SpO2: 99% 98% 98% 99%  Weight:      Height:        Physical Exam Vitals and nursing note reviewed.  Constitutional:      General: He is not in acute distress.    Appearance: Normal appearance. He is not ill-appearing.  HENT:     Mouth/Throat:     Mouth: Mucous membranes are moist.  Eyes:     Pupils: Pupils are equal, round, and reactive to light.  Cardiovascular:     Rate and Rhythm: Normal rate and regular rhythm.     Pulses: Normal pulses.     Heart sounds: Normal heart sounds.  Pulmonary:     Effort: Pulmonary effort is normal.     Breath sounds: Normal breath sounds.  Abdominal:     General: There is no distension.     Palpations: Abdomen is soft.     Tenderness: There is no abdominal tenderness. There is no guarding  or rebound.  Musculoskeletal:     Cervical back: Neck supple.  Skin:    Capillary Refill: Capillary refill takes less than 2 seconds.     Coloration: Skin is pale.  Neurological:     Mental Status: He is alert and oriented to person, place, and time.  Psychiatric:        Mood and Affect: Mood normal.        Thought Content: Thought content normal.      Labs on Admission: I have personally reviewed following labs and imaging studies  CBC: Recent Labs  Lab 02/02/24 2031  WBC 7.7  NEUTROABS 4.2  HGB 5.1*  HCT 16.3*  MCV 92.6  PLT 302   Basic Metabolic Panel: Recent Labs  Lab 02/02/24 2031  NA 143  K 3.8  CL 110  CO2 21*  GLUCOSE 110*  BUN 17  CREATININE 1.20  CALCIUM 8.5*   GFR: Estimated Creatinine Clearance: 59.8 mL/min (by C-G formula based on SCr of 1.2 mg/dL). Liver Function Tests: Recent Labs  Lab 02/02/24 2031  AST 25  ALT 10  ALKPHOS 69  BILITOT 0.2  PROT 5.9*  ALBUMIN 3.7   No results for input(s): LIPASE, AMYLASE in the last 168 hours. No results for input(s): AMMONIA in the last 168 hours. Coagulation Profile: Recent Labs  Lab 02/02/24 2031  INR 0.9   Cardiac Enzymes: No results for input(s): CKTOTAL, CKMB, CKMBINDEX, TROPONINI, TROPONINIHS in the last 168 hours. BNP (last 3 results) No results for input(s): BNP in the last 8760 hours. HbA1C: No results for input(s): HGBA1C in the last 72 hours. CBG: No results for input(s): GLUCAP in the last 168 hours. Lipid Profile: No results for input(s): CHOL, HDL, LDLCALC, TRIG, CHOLHDL, LDLDIRECT in the last 72 hours. Thyroid Function Tests: No results for input(s): TSH, T4TOTAL, FREET4, T3FREE, THYROIDAB in the last 72 hours. Anemia Panel: No results for input(s): VITAMINB12, FOLATE, FERRITIN, TIBC, IRON, RETICCTPCT in the last 72 hours. Urine analysis:    Component Value Date/Time   COLORURINE YELLOW 02/02/2024 2341   APPEARANCEUR  CLEAR 02/02/2024 2341   LABSPEC 1.013 02/02/2024 2341   PHURINE 5.0 02/02/2024 2341   GLUCOSEU NEGATIVE 02/02/2024 2341   HGBUR SMALL (A) 02/02/2024 2341   BILIRUBINUR NEGATIVE 02/02/2024 2341   BILIRUBINUR small (A) 10/12/2022  1119   BILIRUBINUR neg 02/13/2014 1046   KETONESUR NEGATIVE 02/02/2024 2341   PROTEINUR NEGATIVE 02/02/2024 2341   UROBILINOGEN 1.0 10/12/2022 1119   UROBILINOGEN 0.2 08/27/2021 1113   NITRITE NEGATIVE 02/02/2024 2341   LEUKOCYTESUR SMALL (A) 02/02/2024 2341    Radiological Exams on Admission: I have personally reviewed images DG Chest Port 1 View Result Date: 02/02/2024 CLINICAL DATA:  Shortness of breath.  Blurry vision and dizziness. EXAM: PORTABLE CHEST 1 VIEW COMPARISON:  None Available. FINDINGS: The cardiomediastinal contours are normal. The lungs are clear. Pulmonary vasculature is normal. Densities paralleling the lateral chest wall felt to represent extrapleural fat. No significant blunting of costophrenic angles or effusion. No pneumothorax. No acute osseous abnormalities are seen. IMPRESSION: No acute chest findings. Electronically Signed   By: Andrea Gasman M.D.   On: 02/02/2024 20:49     EKG: My personal interpretation of EKG shows:  Normal sinus rhythm heart rate 82    Assessment/Plan: Principal Problem:   GI bleed Active Problems:   Symptomatic anemia   History of sciatica   Bladder stone   Essential hypertension    Assessment and Plan: GI bleeding-melena Symptomatic anemia > Patient presented to emergency department complaining of dark tarry stool over the course of last 1 week.  Patient takes aspirin 81 1 mg daily basis and naproxen  as needed for osteoarthritis.  Denies any hematemesis and hemoptysis. - At presentation to ED patient found hypertensive after blood pressure has been improved.  Hemodynamically stable. -Lab work showed low hemoglobin 5.1.  In the ED 2 unit of blood transfusion has been ordered. - Giving IV Protonix  80  mg followed by continue IV Protonix  40 mg twice daily - Obtaining GI angio bleed study. -ED physician consulted discussed case with [facility] Dr. Rollin plan for see patient in the daytime. -Continue to monitor H&H and transfuse as needed.  Goal to keep hemoglobin above 7. -Continue maintenance fluid LR 125 cc/h. -Patient was on clear liquid diet until 4 AM.  Keeping patient n.p.o. now.   History of sciatica History of bladder stone with intermittent hematuria Patient reported not taking Flomax  anymore.  Essential hypertension -At home patient is not any blood pressure regimen. - Initial systolic blood pressure 192/93 which has been improved to upper 140s range.  In the setting of GI bleed will not control blood pressure aggressively as high risk of circulatory decompensation. - Continue to monitor blood pressure closely if it remains persistently Betapar 160 range will order IV hydralazine  as needed.   DVT prophylaxis:  SCDs Code Status:  Full Code Diet: N.p.o. Disposition Plan: Continue to monitor H&H and transfuse as needed. Consults: Gastroenterology Admission status:   Inpatient, Step Down Unit  Severity of Illness: The appropriate patient status for this patient is INPATIENT. Inpatient status is judged to be reasonable and necessary in order to provide the required intensity of service to ensure the patient's safety. The patient's presenting symptoms, physical exam findings, and initial radiographic and laboratory data in the context of their chronic comorbidities is felt to place them at high risk for further clinical deterioration. Furthermore, it is not anticipated that the patient will be medically stable for discharge from the hospital within 2 midnights of admission.   * I certify that at the point of admission it is my clinical judgment that the patient will require inpatient hospital care spanning beyond 2 midnights from the point of admission due to high intensity of service,  high risk for further deterioration  and high frequency of surveillance required.DEWAINE    Sheelah Ritacco, MD [facility]  How to contact the [facility] Attending or Consulting provider 7A - 7P or covering provider during after hours 7P -7A, for this patient.  Check the care team in [facility] and look for a) attending/consulting [facility] provider listed and b) the [facility] team listed Log into [email or web address] and use [facility]'s universal password to access. If you do not have the password, please contact the hospital operator. Locate the [facility] provider you are looking for under [facility] and page to a number that you can be directly reached. If you still have difficulty reaching the provider, please page the [facility] (Director on Call) for the Hospitalists listed on amion for assistance.  02/03/2024, 4:50 AM

## 2024-02-02 NOTE — ED Provider Notes (Signed)
 [place] EMERGENCY DEPARTMENT AT [facility] Provider Note   CSN: [id number] Arrival date & time: 02/02/24  8051     Patient presents with: Rectal Bleeding and Low Hemoglobin   Jackson Sullivan is a 63 y.o. male.   63 year old male with prior medical history as detailed below presents for evaluation.  Patient with dark tarry stool x 1 week.  Patient was seen at PCP clinic earlier today.  Guaiac was positive.  Hemoglobin is 5.6 from this afternoon.  Patient without history of prior GI bleeding.  He does take aspirin daily.  Patient complains of feeling weak and dizzy.  No reported vomiting.  No reported abdominal pain.  No fever.  Distant colonoscopy performed in 2014.  Several small polyps were identified.  Colonoscopy was with Dr. Rollin.  The history is provided by the patient and medical records.       Prior to Admission medications   Medication Sig Start Date End Date Taking? Authorizing Provider  HYDROcodone -acetaminophen  (NORCO/VICODIN) 5-325 MG tablet Take 1 tablet by mouth every 4 (four) hours as needed for severe pain. 10/12/22   Iola Lukes, FNP  phenazopyridine  (PYRIDIUM ) 200 MG tablet Take 1 tablet (200 mg total) by mouth 3 (three) times daily. 10/12/22   Iola Lukes, FNP  tamsulosin  (FLOMAX ) 0.4 MG CAPS capsule Take 1 capsule (0.4 mg total) by mouth daily. 10/12/22   Murrill, Samantha, FNP    Allergies: Patient has no known allergies.    Review of Systems  All other systems reviewed and are negative.   Updated Vital Signs BP (!) 192/93 (BP Location: Right Arm)   Pulse (!) 112   Temp 98.3 F (36.8 C) (Oral)   Resp 18   Ht 5' 4 (1.626 m)   Wt 74.8 kg   SpO2 100%   BMI 28.32 kg/m   Physical Exam Vitals and nursing note reviewed.  Constitutional:      General: He is not in acute distress.    Appearance: Normal appearance. He is well-developed.  HENT:     Head: Normocephalic and atraumatic.  Eyes:     Conjunctiva/sclera:  Conjunctivae normal.     Pupils: Pupils are equal, round, and reactive to light.  Cardiovascular:     Rate and Rhythm: Normal rate and regular rhythm.     Heart sounds: Normal heart sounds.  Pulmonary:     Effort: Pulmonary effort is normal. No respiratory distress.     Breath sounds: Normal breath sounds.  Abdominal:     General: There is no distension.     Palpations: Abdomen is soft.     Tenderness: There is no abdominal tenderness.  Musculoskeletal:        General: No deformity. Normal range of motion.     Cervical back: Normal range of motion and neck supple.  Skin:    General: Skin is warm and dry.  Neurological:     General: No focal deficit present.     Mental Status: He is alert and oriented to person, place, and time.     (all labs ordered are listed, but only abnormal results are displayed) Labs Reviewed  COMPREHENSIVE METABOLIC PANEL WITH GFR  CBC WITH DIFFERENTIAL/PLATELET  PROTIME-INR  URINALYSIS, W/ REFLEX TO CULTURE (INFECTION SUSPECTED)  TYPE AND SCREEN  PREPARE RBC (CROSSMATCH)    EKG: EKG Interpretation Date/Time:  Friday February 02 2024 20:17:26 EDT Ventricular Rate:  82 PR Interval:  144 QRS Duration:  83 QT Interval:  350 QTC Calculation: 409  R Axis:   26  Text Interpretation: Sinus rhythm Confirmed by Laurice Coy [id number]) on 02/02/2024 8:20:45 PM  Radiology: No results found.   Procedures   Medications Ordered in the ED  0.9 %  sodium chloride  infusion (Manually program via Guardrails IV Fluids) (has no administration in time range)  pantoprazole  (PROTONIX ) injection 80 mg (80 mg Intravenous Given 02/02/24 2030)                                    Medical Decision Making Patient with GI bleeding.  He is on aspirin.  Hemoglobin is 5.6 in the outpatient setting earlier today.  Hemodynamics are fairly well-preserved.  Patient would benefit from blood transfusion.  2 units PRBCs ordered.  Case discussed briefly with Dr. Rollin - patient  is not currently followed by him.  He will pass on the patient's information to [facility] who can consult on the patient in the a.m.  Hospitalist service made aware of case and need for evaluation for admission.  Amount and/or Complexity of Data Reviewed Labs: ordered. Radiology: ordered.  Risk Prescription drug management. Decision regarding hospitalization.        Final diagnoses:  Gastrointestinal hemorrhage, unspecified gastrointestinal hemorrhage type    ED Discharge Orders     None          Laurice Coy BROCKS, MD 02/02/24 2329

## 2024-02-02 NOTE — Progress Notes (Signed)
 Have called and left VM on both patient and daughter Jessica's phones directing them to proceed to the ED for abnormal lab result. If they call back, please let them know his hgb is critically low. Given his symptoms of GIB and SOB, he should proceed immediately to the ED for evaluation.

## 2024-02-02 NOTE — Progress Notes (Signed)
 Subjective:   There are no active problems to display for this patient.    Chief Complaint  Patient presents with  . Fatigue    Shortness of breath, feels like he can hear his heart beat in his ear. Last Thursday, noticed dark red blood in his stool and it has continued since. No vomiting, no abdominal pain. Also experiencing cramps in lower legs.      History of Present Illness  This is a 63 year old male with a history of high blood pressure and high cholesterol presenting with fatigue and shortness of breath.  Patient states approximately 5 days ago, he an episode of loose watery stool with bright red blood, this was an isolated occurrence until today when he noticed blood around his rectum which became concerning to him.  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  No shortness of breath at rest.  He states he can occasionally hear his heart pounding in his ears but is denying chest pain.  He feels unusually fatigued and has been having cramping in his lower extremities.  He became concerned when he had an episode of blurry vision on 01/27/2024.  Denies headache, fever, chills, body aches.  Patient states he takes Aleve  and a baby aspirin daily otherwise he is not on any daily medication.  He denies nausea vomiting or diarrhea.  No abdominal pain.  Parts of patient history reviewed include PMH, problem list, medications, allergies, and social history.  Objective:   Vitals:   02/02/24 1445 02/02/24 1538 02/02/24 1539  BP: 155/86    Pulse: 82    Resp: 18    Temp: 97.9 F (36.6 C)    TempSrc: Tympanic    SpO2: 100% 100% 100%   Orthostatic Vitals:   02/02/24 1537 02/02/24 1538 02/02/24 1539  Patient Position: Lying Sitting Standing  Orthostatic BP: 149/89 147/75 157/68  Orthostatic Pulse: 84 84 85    Physical Exam Vitals and nursing note reviewed. Exam conducted with a chaperone present.   Constitutional:      General: He is not in acute distress.    Appearance: Normal appearance. He is not ill-appearing or toxic-appearing.  HENT:     Head: Normocephalic and atraumatic.     Right Ear: Tympanic membrane and ear canal normal.     Left Ear: Tympanic membrane and ear canal normal.     Nose: Nose normal.     Mouth/Throat:     Mouth: Mucous membranes are moist.  Eyes:     Extraocular Movements: Extraocular movements intact.     Conjunctiva/sclera: Conjunctivae normal.     Pupils: Pupils are equal, round, and reactive to light.  Cardiovascular:     Rate and Rhythm: Normal rate and regular rhythm.     Pulses: Normal pulses.     Heart sounds: Normal heart sounds. No murmur heard. Pulmonary:     Effort: Pulmonary effort is normal. No respiratory distress.     Breath sounds: Normal breath sounds.  Abdominal:     Palpations: Abdomen is soft.     Tenderness: There is no abdominal tenderness. There is no guarding or rebound.  Genitourinary:    Rectum: Guaiac result positive.  Musculoskeletal:     Right lower leg: 1+ Pitting Edema present.     Left lower leg: 1+ Pitting Edema present.  Skin:    General: Skin is cool.     Capillary Refill: Capillary refill takes  2 to 3 seconds.     Coloration: Skin is pale.  Neurological:     Mental Status: He is alert.    EKG: 78 BPM, P waves present, normal QRS, nonspecific T wave abnormalities, no comparison.  No signs of acute ischemia/infarct.  Assessment/Plan:   Assessment & Plan  Shortness of breath Fatigue Heme positive stool  This is a 63 year old male with hx of bloody stools, shortness of breath, fatigue, and leg cramping. On presentation, he is pale is appearance with delayed cap refill and bilateral lower extremity pitting edema 1+. He is grossly guaiac positive. Physical exam is otherwise unremarkable. Chest CTA, heart RRR. Abdomen soft and non-tender without peritoneal signs.  Denies dizziness, chest pain, or vision  changes at time of my exam.  There is no hypoxia, increased work of breathing, or tachypnea.  He is hemodynamically stable. I suspect lower GI bleed secondary to daily NSAID and baby aspirin.  [place] Score For Safe Discharge after lower GI Bleed -1+ for age -85+ male  -1+ blood on DTR  -1+ systolic blood pressure  Negative orthostatic vital signs. EKG without evidence of acute ischemia. UC location without Cxr today. STAT CBC, CMP, and BNP.  Pending results, may need ED evaluation.  Given 500NS and IV famotide in clinic. Urgent referral placed to GI. Rx for 40mg  Omeprazole daily.  Strict return precautions advised.  Stop NSAIDs and baby aspirin.  Follow-up with PCP next week.  Patient verbalized understanding.   Urgent Follow Up with Specialist   Patient has been instructed on medications, dosages, side effects, and possible interactions as associated with each diagnosis in my impression and plan above. Patient education (verbal/handout) given on diagnosis, pathophysiology, treatment of diagnosis, side effects of medication use for treatment, restrictions while taking medication, and supportive measures.   Patient was instructed on when to follow up and know that they can follow up here, with their PCP, Urgent Care, ED.  They have been instructed that if symptoms worsen that should return to the clinic, go to the nearest ED, or activate EMS. Red Flags associated with their diagnoses were reviewed and patient was educated on what to do if red.       Patient agreed with plan and voiced understanding.  No barriers to adherence perceived by myself.  Portions of this note may have been dictated using Dragon dictation software/hardware and may contain grammatical or spelling errors.   Electronically signed by:   Sotero Pore, DNP ENP-C FNP-C [facility]  02/02/2024 4:38 PM

## 2024-02-02 NOTE — ED Notes (Signed)
 Patient transported to CT

## 2024-02-02 NOTE — ED Triage Notes (Signed)
 Patient c/o GI bleeding x 5 days. Patient report PCP recommended to come to ED d/t Hgb 5.6.  Patient report blurred vision and dizziness. Patient report nausea denies vomiting.  Patient denies SOB.

## 2024-02-03 DIAGNOSIS — K922 Gastrointestinal hemorrhage, unspecified: Secondary | ICD-10-CM

## 2024-02-03 LAB — COMPREHENSIVE METABOLIC PANEL WITH GFR
ALT: 9 U/L (ref 0–44)
AST: 17 U/L (ref 15–41)
Albumin: 3.6 g/dL (ref 3.5–5.0)
Alkaline Phosphatase: 70 U/L (ref 38–126)
Anion gap: 11 (ref 5–15)
BUN: 15 mg/dL (ref 8–23)
CO2: 21 mmol/L — ABNORMAL LOW (ref 22–32)
Calcium: 8.5 mg/dL — ABNORMAL LOW (ref 8.9–10.3)
Chloride: 109 mmol/L (ref 98–111)
Creatinine, Ser: 0.95 mg/dL (ref 0.61–1.24)
GFR, Estimated: >60 mL/min (ref >60)
Glucose, Bld: 106 mg/dL — ABNORMAL HIGH (ref 70–99)
Potassium: 3.6 mmol/L (ref 3.5–5.1)
Sodium: 142 mmol/L (ref 135–145)
Total Bilirubin: 0.4 mg/dL (ref 0.0–1.2)
Total Protein: 5.7 g/dL — ABNORMAL LOW (ref 6.5–8.1)

## 2024-02-03 LAB — URINALYSIS, W/ REFLEX TO CULTURE (INFECTION SUSPECTED)
Bacteria, UA: NONE SEEN
Bilirubin Urine: NEGATIVE
Glucose, UA: NEGATIVE mg/dL
Ketones, ur: NEGATIVE mg/dL
Nitrite: NEGATIVE
Protein, ur: NEGATIVE mg/dL
Specific Gravity, Urine: 1.013 (ref 1.005–1.030)
pH: 5 (ref 5.0–8.0)

## 2024-02-03 LAB — CBC
HCT: 23.6 % — ABNORMAL LOW (ref 39.0–52.0)
Hemoglobin: 7.5 g/dL — ABNORMAL LOW (ref 13.0–17.0)
MCH: 27.9 pg (ref 26.0–34.0)
MCHC: 31.8 g/dL (ref 30.0–36.0)
MCV: 87.7 fL (ref 80.0–100.0)
Platelets: 296 K/uL (ref 150–400)
RBC: 2.69 MIL/uL — ABNORMAL LOW (ref 4.22–5.81)
RDW: 14.9 % (ref 11.5–15.5)
WBC: 7.5 K/uL (ref 4.0–10.5)
nRBC: 0 % (ref 0.0–0.2)

## 2024-02-03 LAB — HIV ANTIBODY (ROUTINE TESTING W REFLEX): HIV Screen 4th Generation wRfx: NONREACTIVE

## 2024-02-03 LAB — HEMOGLOBIN AND HEMATOCRIT, BLOOD
HCT: 23 % — ABNORMAL LOW (ref 39.0–52.0)
HCT: 22.3 % — ABNORMAL LOW (ref 39.0–52.0)
Hemoglobin: 7.3 g/dL — ABNORMAL LOW (ref 13.0–17.0)
Hemoglobin: 7.3 g/dL — ABNORMAL LOW (ref 13.0–17.0)

## 2024-02-03 LAB — PROTIME-INR
INR: 0.9 (ref 0.8–1.2)
Prothrombin Time: 12.9 s (ref 11.4–15.2)

## 2024-02-03 MED ORDER — HYDRALAZINE HCL 20 MG/ML IJ SOLN
5.0000 mg | Freq: Four times a day (QID) | INTRAMUSCULAR | Status: DC | PRN
  Administered 2024-02-04: 5 mg via INTRAVENOUS
  Filled 2024-02-03: qty 1

## 2024-02-03 MED ORDER — SODIUM CHLORIDE 0.9 % IV SOLN: INTRAVENOUS | Status: DC

## 2024-02-03 MED ORDER — TAMSULOSIN HCL 0.4 MG PO CAPS
0.4000 mg | ORAL_CAPSULE | Freq: Every day | ORAL | Status: DC
  Administered 2024-02-03 – 2024-02-04 (×2): 0.4 mg via ORAL
  Filled 2024-02-03 (×2): qty 1

## 2024-02-03 MED ORDER — PEG 3350-KCL-NA BICARB-NACL 420 G PO SOLR
4000.0000 mL | Freq: Once | ORAL | Status: AC
  Administered 2024-02-03: 4000 mL via ORAL

## 2024-02-03 NOTE — Plan of Care (Signed)
   Problem: Clinical Measurements: Goal: Will remain free from infection Outcome: Progressing   Problem: Clinical Measurements: Goal: Respiratory complications will improve Outcome: Progressing   Problem: Clinical Measurements: Goal: Cardiovascular complication will be avoided Outcome: Progressing   Problem: Activity: Goal: Risk for activity intolerance will decrease Outcome: Progressing

## 2024-02-03 NOTE — Progress Notes (Signed)
 PROGRESS NOTE  [name] FMW:[id number] DOB: [birth year] DOA: 02/02/2024 PCP: Patient, No Pcp Per   LOS: 1 day   Brief narrative:  Jackson Sullivan is a 63 y.o. male with past medical history  sciatica, bladder stone with chronic intermittent hematuria, and essential hypertension presented to the emergency department for evaluation for dark-colored stool for 1 week and at the primary care clinic patient was noted to have  guaiac positive stools and hemoglobin of 5.6.  On aspirin daily and Aleve  naproxen  as needed.  In the ED patient was noted to be hypotensive but hemodynamically otherwise stable.  Hemoglobin is low at 5.1.  CMP showed low bicarb at 21.  Chest x-ray without any infiltrate.  2 units of packed RBC was ordered in the ED.  [facility] Dr. Rollin was consulted and patient was admitted hospital for further evaluation and treatment.       Assessment/Plan: Principal Problem:   GI bleed Active Problems:   Symptomatic anemia   History of sciatica   Bladder stone   Essential hypertension  GI bleeding-melena Symptomatic anemia Likely secondary to being on NSAIDs.  Had melanotic stool but no hematemesis..  Initial hemoglobin of 5.1.  Received 2 units of packed RBC.  Continue IV Protonix .,  CTA abdomen been ordered.  Continue IV fluids supportive care.  GI has recommended EGD and colonoscopy tomorrow. Will continue on clears.    History of sciatica History of bladder stone with intermittent hematuria Not on Flomax  anymore.  Has been retaining urine will get In-N-Out cath.  Will start Flomax .   Essential hypertension On IV hydralazine  for now.  DVT prophylaxis: SCDs Start: 02/02/24 2319 Place TED hose Start: 02/02/24 2319   Disposition: Home likely in 1 to 2 days  Status is: Inpatient  Remains inpatient appropriate because: GI bleed    Code Status:     Code Status: Full Code  Family Communication: Spoke with the patient's spouse and brother at  bedside  Consultants: GI  Procedures: PRBC transfusion 2 units  Anti-infectives:  None  Anti-infectives (From admission, onward)    None      Subjective: Today, patient was seen and examined at bedside.  Patient denies any dizziness, lightheadedness nausea vomiting or abdominal pain.  Had episode of Dark stool this morning.   Objective: Vitals:   02/03/24 1115 02/03/24 1200  BP: 139/85 (!) 164/93  Pulse:  75  Resp: (!) 21 20  Temp:    SpO2: 97% 98%    Intake/Output Summary (Last 24 hours) at 02/03/2024 1219 Last data filed at 02/03/2024 0945 Gross per 24 hour  Intake 420 ml  Output 900 ml  Net -480 ml   Filed Weights   02/02/24 1956  Weight: 74.8 kg   Body mass index is 28.32 kg/m.   Physical Exam:  GENERAL: Patient is alert awake and oriented. Not in obvious distress. HENT: Mild pallor noted pupils equally reactive to light. Oral mucosa is moist NECK: is supple, no gross swelling noted. CHEST: Clear to auscultation. No crackles or wheezes.   CVS: S1 and S2 heard, no murmur. Regular rate and rhythm.  ABDOMEN: Soft, non-tender, bowel sounds are present. EXTREMITIES: No edema. CNS: Cranial nerves are intact. No focal motor deficits. SKIN: warm and dry without rashes.  Data Review: I have personally reviewed the following laboratory data and studies,  CBC: Recent Labs  Lab 02/02/24 2031 02/03/24 0742 02/03/24 1156  WBC 7.7 7.5  --   NEUTROABS 4.2  --   --  HGB 5.1* 7.5* 7.3*  HCT 16.3* 23.6* 23.0*  MCV 92.6 87.7  --   PLT 302 296  --    Basic Metabolic Panel: Recent Labs  Lab 02/02/24 2031 02/03/24 0742  NA 143 142  K 3.8 3.6  CL 110 109  CO2 21* 21*  GLUCOSE 110* 106*  BUN 17 15  CREATININE 1.20 0.95  CALCIUM 8.5* 8.5*   Liver Function Tests: Recent Labs  Lab 02/02/24 2031 02/03/24 0742  AST 25 17  ALT 10 9  ALKPHOS 69 70  BILITOT 0.2 0.4  PROT 5.9* 5.7*  ALBUMIN 3.7 3.6   No results for input(s): LIPASE, AMYLASE in  the last 168 hours. No results for input(s): AMMONIA in the last 168 hours. Cardiac Enzymes: No results for input(s): CKTOTAL, CKMB, CKMBINDEX, TROPONINI in the last 168 hours. BNP (last 3 results) No results for input(s): BNP in the last 8760 hours.  ProBNP (last 3 results) No results for input(s): PROBNP in the last 8760 hours.  CBG: No results for input(s): GLUCAP in the last 168 hours. No results found for this or any previous visit (from the past 240 hours).   Studies: DG Chest Port 1 View Result Date: 02/02/2024 CLINICAL DATA:  Shortness of breath.  Blurry vision and dizziness. EXAM: PORTABLE CHEST 1 VIEW COMPARISON:  None Available. FINDINGS: The cardiomediastinal contours are normal. The lungs are clear. Pulmonary vasculature is normal. Densities paralleling the lateral chest wall felt to represent extrapleural fat. No significant blunting of costophrenic angles or effusion. No pneumothorax. No acute osseous abnormalities are seen. IMPRESSION: No acute chest findings. Electronically Signed   By: Andrea Gasman M.D.   On: 02/02/2024 20:49      Vernal Alstrom, MD  [facility] 02/03/2024  If 7PM-7AM, please contact night-coverage

## 2024-02-03 NOTE — Consult Note (Signed)
 Reason for Consult: Severe IDA, melena, and heme positive stool Referring Physician: [facility]  Jackson Sullivan HPI: This is a 63 year old male with a PMH of HTN and nephrolithiasis admitted for a severe IDA.  He states that his symptoms started last Thursday with multiple melenic stools.  He had the greatest frequency from Thursday until Saturday.  From that point he averaged about one melenic stool per day.  Over the interval time period he noticed that he was having some SOB and blurry vision.  For those symptoms he presented to the urgent care yesterday and he was noted to have an HGB of 5.6 g/dL.  The patient was also found to have heme positive stool and he was referred to the ER.  In the ER his severe anemia was confirmed at 5.1 g/dL.  Since admission he feels better with IV hydration and blood transfusions.  Over the past year he reports using ibuprofen daily for kidney pain.  He denies any abdominal pain, nausea, vomiting, or GERD symptoms.  The patient had a colonoscopy in 2014 and it was a normal examination.  Past Medical History:  Diagnosis Date   Bladder stones    Dysuria    ED (erectile dysfunction)    Frequency of urination    Hematuria    History of hypertension    PER PT 2015  NO LONGER AN ISSUE   History of kidney stones    Hypertension    Nephrolithiasis    BILATERAL    Rash    LEGS     Past Surgical History:  Procedure Laterality Date   COLONOSCOPY N/A 05/17/2013   Procedure: COLONOSCOPY;  Surgeon: Belvie JONETTA Just, MD;  Location: [facility];  Service: Endoscopy;  Laterality: N/A;   CYSTOSCOPY W/ RETROGRADES Bilateral 01/26/2016   Procedure: CYSTOSCOPY WITH RETROGRADE PYELOGRAM;  Surgeon: Donnice Brooks, MD;  Location: [facility];  Service: Urology;  Laterality: Bilateral;   CYSTOSCOPY WITH BIOPSY  01/26/2016   Procedure: CYSTOSCOPY WITH  URETHRAL BIOPSY;  Surgeon: Donnice Brooks, MD;  Location: [facility];  Service:  Urology;;   CYSTOSCOPY WITH LITHOLAPAXY N/A 01/26/2016   Procedure: CYSTOSCOPY WITH LITHOLAPAXY;  Surgeon: Donnice Brooks, MD;  Location: [facility];  Service: Urology;  Laterality: N/A;   CYSTOSCOPY WITH LITHOLAPAXY N/A 10/18/2019   Procedure: CYSTOSCOPY WITH LITHOLAPAXY, TRANS INCISIONAL EXCISION OF PROSTATE, BLADDER FULGERATION;  Surgeon: Devere Lonni Righter, MD;  Location: [facility];  Service: Urology;  Laterality: N/A;   CYSTOSCOPY WITH STENT PLACEMENT Left 01/26/2016   Procedure: CYSTOSCOPY WITH STENT PLACEMENT;  Surgeon: Donnice Brooks, MD;  Location: [facility];  Service: Urology;  Laterality: Left;   EXTRACORPOREAL SHOCK WAVE LITHOTRIPSY Left 12-14-2015;   03-07-2011   HOLMIUM LASER APPLICATION N/A 01/26/2016   Procedure: HOLMIUM LASER APPLICATION;  Surgeon: Donnice Brooks, MD;  Location: [facility];  Service: Urology;  Laterality: N/A;   HOLMIUM LASER APPLICATION  10/18/2019   Procedure: HOLMIUM LASER APPLICATION;  Surgeon: Devere Lonni Righter, MD;  Location: [facility];  Service: Urology;;   ORCHIECTOMY  1995    in [place]   STONE EXTRACTION WITH BASKET Left 01/26/2016   Procedure: STONE EXTRACTION WITH BASKET;  Surgeon: Donnice Brooks, MD;  Location: [facility];  Service: Urology;  Laterality: Left;   URETEROSCOPY Left 01/26/2016   Procedure: CYSTOSCOPY WITH  URETEROSCOPY;  Surgeon: Donnice Brooks, MD;  Location: [facility];  Service: Urology;  Laterality: Left;   URETEROSCOPY WITH HOLMIUM LASER LITHOTRIPSY Bilateral 10/18/2019   Procedure: URETEROSCOPY WITH HOLMIUM LASER LITHOTRIPSY/ STENT PLACEMENT;  Surgeon: Devere Lonni Righter, MD;  Location: [facility];  Service: Urology;  Laterality: Bilateral;    History reviewed. No pertinent family history.  Social History:  reports that he has been smoking cigarettes. He has a 6.3 pack-year  smoking history. He has never used smokeless tobacco. He reports that he does not drink alcohol and does not use drugs.  Allergies: No Known Allergies  Medications: Scheduled:  pantoprazole  (PROTONIX ) IV  40 mg Intravenous Q12H   polyethylene glycol-electrolytes  4,000 mL Oral Once   sodium chloride  flush  3 mL Intravenous Q12H   Continuous:  sodium chloride      sodium chloride      lactated ringers  125 mL/hr at 02/03/24 0017    Results for orders placed or performed during the hospital encounter of 02/02/24 (from the past 24 hours)  ABO/Rh     Status: None   Collection Time: 02/02/24  8:22 PM  Result Value Ref Range   ABO/RH(D)      A POS Performed at [facility], [address], [place], [place] [address]   Comprehensive metabolic panel     Status: Abnormal   Collection Time: 02/02/24  8:31 PM  Result Value Ref Range   Sodium 143 135 - 145 mmol/L   Potassium 3.8 3.5 - 5.1 mmol/L   Chloride 110 98 - 111 mmol/L   CO2 21 (L) 22 - 32 mmol/L   Glucose, Bld 110 (H) 70 - 99 mg/dL   BUN 17 8 - 23 mg/dL   Creatinine, Ser 8.79 0.61 - 1.24 mg/dL   Calcium 8.5 (L) 8.9 - 10.3 mg/dL   Total Protein 5.9 (L) 6.5 - 8.1 g/dL   Albumin 3.7 3.5 - 5.0 g/dL   AST 25 15 - 41 U/L   ALT 10 0 - 44 U/L   Alkaline Phosphatase 69 38 - 126 U/L   Total Bilirubin 0.2 0.0 - 1.2 mg/dL   GFR, Estimated >39 >39 mL/min   Anion gap 12 5 - 15  CBC with Differential     Status: Abnormal   Collection Time: 02/02/24  8:31 PM  Result Value Ref Range   WBC 7.7 4.0 - 10.5 K/uL   RBC 1.76 (L) 4.22 - 5.81 MIL/uL   Hemoglobin 5.1 (LL) 13.0 - 17.0 g/dL   HCT 83.6 (L) 60.9 - 47.9 %   MCV 92.6 80.0 - 100.0 fL   MCH 29.0 26.0 - 34.0 pg   MCHC 31.3 30.0 - 36.0 g/dL   RDW 86.0 88.4 - 84.4 %   Platelets 302 150 - 400 K/uL   nRBC 0.0 0.0 - 0.2 %   Neutrophils Relative % 54 %   Neutro Abs 4.2 1.7 - 7.7 K/uL   Lymphocytes Relative 33 %   Lymphs Abs 2.5 0.7 - 4.0 K/uL   Monocytes Relative 9 %    Monocytes Absolute 0.7 0.1 - 1.0 K/uL   Eosinophils Relative 3 %   Eosinophils Absolute 0.2 0.0 - 0.5 K/uL   Basophils Relative 1 %   Basophils Absolute 0.0 0.0 - 0.1 K/uL   Immature Granulocytes 0 %   Abs Immature Granulocytes 0.02 0.00 - 0.07 K/uL  [facility] and screen  [facility]     Status: None (Preliminary result)   Collection Time: 02/02/24  8:31 PM  Result Value Ref Range   ABO/RH(D)  A POS    Antibody Screen NEG    Sample Expiration 02/05/2024,2359    Unit Number [id number]    Blood Component Type RED CELLS,LR    Unit division 00    Status of Unit ISSUED    Transfusion Status OK TO TRANSFUSE    Crossmatch Result      Compatible Performed at [facility], [address], [place], [place] [address]    Unit Number [id number]    Blood Component Type RED CELLS,LR    Unit division 00    Status of Unit ISSUED    Transfusion Status OK TO TRANSFUSE    Crossmatch Result Compatible   Protime-INR     Status: None   Collection Time: 02/02/24  8:31 PM  Result Value Ref Range   Prothrombin Time 13.0 11.4 - 15.2 seconds   INR 0.9 0.8 - 1.2  Prepare RBC (crossmatch)     Status: None   Collection Time: 02/02/24  8:31 PM  Result Value Ref Range   Order Confirmation      ORDER PROCESSED BY BLOOD BANK Performed at [facility], [address], [place], [place] [address]   Urinalysis, w/ Reflex to Culture (Infection Suspected) -Urine, Clean Catch     Status: Abnormal   Collection Time: 02/02/24 11:41 PM  Result Value Ref Range   Specimen Source URINE, CLEAN CATCH    Color, Urine YELLOW YELLOW   APPearance CLEAR CLEAR   Specific Gravity, Urine 1.013 1.005 - 1.030   pH 5.0 5.0 - 8.0   Glucose, UA NEGATIVE NEGATIVE mg/dL   Hgb urine dipstick SMALL (A) NEGATIVE   Bilirubin Urine NEGATIVE NEGATIVE   Ketones, ur NEGATIVE NEGATIVE mg/dL   Protein, ur NEGATIVE NEGATIVE mg/dL   Nitrite NEGATIVE NEGATIVE   Leukocytes,Ua SMALL (A)  NEGATIVE   RBC / HPF 6-10 0 - 5 RBC/hpf   WBC, UA 11-20 0 - 5 WBC/hpf   Bacteria, UA NONE SEEN NONE SEEN   Squamous Epithelial / HPF 0-5 0 - 5 /HPF   Mucus PRESENT      DG Chest Port 1 View Result Date: 02/02/2024 CLINICAL DATA:  Shortness of breath.  Blurry vision and dizziness. EXAM: PORTABLE CHEST 1 VIEW COMPARISON:  None Available. FINDINGS: The cardiomediastinal contours are normal. The lungs are clear. Pulmonary vasculature is normal. Densities paralleling the lateral chest wall felt to represent extrapleural fat. No significant blunting of costophrenic angles or effusion. No pneumothorax. No acute osseous abnormalities are seen. IMPRESSION: No acute chest findings. Electronically Signed   By: Andrea Gasman M.D.   On: 02/02/2024 20:49    ROS:  As stated above in the HPI otherwise negative.  Blood pressure (!) 148/92, pulse 76, temperature 98.2 F (36.8 C), resp. rate 18, height 5' 4 (1.626 m), weight 74.8 kg, SpO2 96%.    PE: Gen: NAD, Alert and Oriented HEENT:  [place]/AT, EOMI Neck: Supple, no LAD Lungs: CTA Bilaterally CV: RRR without M/G/R ABD: Soft, NTND, +BS Ext: No C/C/E  Assessment/Plan: 1) IDA. 2) Heme positive stools. 3) NSAID use.   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  Further evaluation with an EGD/colonoscopy is required to clear his GI tract.  Plan: 1) EGD/colonoscopy tomorrow. 2) PPI. 3) Follow up CBC.  Kitara Hebb D 02/03/2024, 7:17 AM

## 2024-02-03 NOTE — ED Notes (Signed)
 Patient ambulated to the bathroom, States being unable to urinate.

## 2024-02-03 NOTE — Hospital Course (Signed)
 Jackson Sullivan is a 63 y.o. male with past medical history  sciatica, bladder stone with chronic intermittent hematuria, and essential hypertension presented to the emergency department for evaluation for dark-colored stool for 1 week and at the primary care clinic patient was noted to have  guaiac positive stools and hemoglobin of 5.6.  On aspirin daily and Aleve  naproxen  as needed.  In the ED patient was noted to be hypotensive but hemodynamically otherwise stable.  Hemoglobin is low at 5.1.  CMP showed low bicarb at 21.  Chest x-ray without any infiltrate.  2 units of packed RBC was ordered in the ED.  [facility] Dr. Rollin was consulted and patient was admitted hospital for further evaluation and treatment.    Assessment and Plan:  GI bleeding-melena Symptomatic anemia Likely secondary to being on NSAIDs.  Had melanotic stool but no hematemesis..  Initial hemoglobin of 5.1.  Received 2 units of packed RBC.  Continue IV Protonix .,  CTA abdomen been ordered.  Continue IV fluids supportive care.  GI has recommended EGD and colonoscopy tomorrow.    History of sciatica History of bladder stone with intermittent hematuria Not on Flomax  anymore.  Has been retaining urine will get In-N-Out cath.   Essential hypertension On IV hydralazine  for now.

## 2024-02-03 NOTE — Anesthesia Preprocedure Evaluation (Signed)
 Anesthesia Evaluation  Patient identified by MRN, date of birth, ID band Patient awake    Reviewed: Allergy & Precautions, NPO status , Patient's Chart, lab work & pertinent test results  History of Anesthesia Complications Negative for: history of anesthetic complications  Airway Mallampati: II  TM Distance: >3 FB Neck ROM: Full    Dental  (+) Edentulous Lower, Edentulous Upper   Pulmonary Current Smoker and Patient abstained from smoking.   Pulmonary exam normal        Cardiovascular hypertension, Normal cardiovascular exam     Neuro/Psych negative neurological ROS     GI/Hepatic negative GI ROS, Neg liver ROS,,,  Endo/Other  negative endocrine ROS    Renal/GU negative Renal ROS  negative genitourinary   Musculoskeletal negative musculoskeletal ROS (+)    Abdominal   Peds  Hematology  (+) Blood dyscrasia (Hgb 7.0), anemia   Anesthesia Other Findings Day of surgery medications reviewed with patient.  Reproductive/Obstetrics negative OB ROS                              Anesthesia Physical Anesthesia Plan  ASA: 3 and emergent  Anesthesia Plan: MAC   Post-op Pain Management: Minimal or no pain anticipated   Induction:   PONV Risk Score and Plan: 0 and Treatment may vary due to age or medical condition and Propofol  infusion  Airway Management Planned: Natural Airway and Simple Face Mask  Additional Equipment: None  Intra-op Plan:   Post-operative Plan:   Informed Consent: I have reviewed the patients History and Physical, chart, labs and discussed the procedure including the risks, benefits and alternatives for the proposed anesthesia with the patient or authorized representative who has indicated his/her understanding and acceptance.       Plan Discussed with: CRNA  Anesthesia Plan Comments:          Anesthesia Quick Evaluation

## 2024-02-04 ENCOUNTER — Encounter (HOSPITAL_COMMUNITY): Payer: Self-pay | Admitting: Anesthesiology

## 2024-02-04 ENCOUNTER — Encounter (HOSPITAL_COMMUNITY): Payer: Self-pay | Admitting: Internal Medicine

## 2024-02-04 ENCOUNTER — Inpatient Hospital Stay (HOSPITAL_COMMUNITY): Payer: MEDICAID | Admitting: Anesthesiology

## 2024-02-04 ENCOUNTER — Encounter (HOSPITAL_COMMUNITY)
Admission: EM | Disposition: A | Discharge status: Home / Self Care | Payer: Self-pay | Source: Ambulatory Visit | Attending: Internal Medicine

## 2024-02-04 DIAGNOSIS — I10 Essential (primary) hypertension: Secondary | ICD-10-CM

## 2024-02-04 DIAGNOSIS — K5730 Diverticulosis of large intestine without perforation or abscess without bleeding: Secondary | ICD-10-CM

## 2024-02-04 DIAGNOSIS — K259 Gastric ulcer, unspecified as acute or chronic, without hemorrhage or perforation: Secondary | ICD-10-CM

## 2024-02-04 DIAGNOSIS — F17210 Nicotine dependence, cigarettes, uncomplicated: Secondary | ICD-10-CM

## 2024-02-04 HISTORY — PX: ESOPHAGOGASTRODUODENOSCOPY: SHX5428

## 2024-02-04 HISTORY — PX: COLONOSCOPY: SHX5424

## 2024-02-04 LAB — CBC
HCT: 22.2 % — ABNORMAL LOW (ref 39.0–52.0)
Hemoglobin: 7 g/dL — ABNORMAL LOW (ref 13.0–17.0)
MCH: 27.7 pg (ref 26.0–34.0)
MCHC: 31.5 g/dL (ref 30.0–36.0)
MCV: 87.7 fL (ref 80.0–100.0)
Platelets: 311 K/uL (ref 150–400)
RBC: 2.53 MIL/uL — ABNORMAL LOW (ref 4.22–5.81)
RDW: 14.6 % (ref 11.5–15.5)
WBC: 6.9 K/uL (ref 4.0–10.5)
nRBC: 0 % (ref 0.0–0.2)

## 2024-02-04 LAB — MAGNESIUM: Magnesium: 2.2 mg/dL (ref 1.7–2.4)

## 2024-02-04 LAB — BASIC METABOLIC PANEL WITH GFR
Anion gap: 10 (ref 5–15)
BUN: 9 mg/dL (ref 8–23)
CO2: 23 mmol/L (ref 22–32)
Calcium: 8.5 mg/dL — ABNORMAL LOW (ref 8.9–10.3)
Chloride: 108 mmol/L (ref 98–111)
Creatinine, Ser: 0.86 mg/dL (ref 0.61–1.24)
GFR, Estimated: >60 mL/min (ref >60)
Glucose, Bld: 92 mg/dL (ref 70–99)
Potassium: 3.5 mmol/L (ref 3.5–5.1)
Sodium: 141 mmol/L (ref 135–145)

## 2024-02-04 LAB — HEMOGLOBIN AND HEMATOCRIT, BLOOD
HCT: 26.3 % — ABNORMAL LOW (ref 39.0–52.0)
Hemoglobin: 8.4 g/dL — ABNORMAL LOW (ref 13.0–17.0)

## 2024-02-04 LAB — PREPARE RBC (CROSSMATCH)

## 2024-02-04 SURGERY — COLONOSCOPY
Anesthesia: Monitor Anesthesia Care

## 2024-02-04 MED ORDER — EPHEDRINE SULFATE-NACL 50-0.9 MG/10ML-% IV SOSY
PREFILLED_SYRINGE | INTRAVENOUS | Status: DC | PRN
Start: 2024-02-04 — End: 2024-02-04
  Administered 2024-02-04: 5 mg via INTRAVENOUS

## 2024-02-04 MED ORDER — HYDRALAZINE HCL 20 MG/ML IJ SOLN
10.0000 mg | Freq: Four times a day (QID) | INTRAMUSCULAR | Status: DC | PRN
  Administered 2024-02-05: 10 mg via INTRAVENOUS
  Filled 2024-02-04: qty 1

## 2024-02-04 MED ORDER — PROPOFOL 10 MG/ML IV BOLUS
INTRAVENOUS | Status: DC | PRN
  Administered 2024-02-04: 120 mg via INTRAVENOUS
  Administered 2024-02-04: 40 mg via INTRAVENOUS

## 2024-02-04 MED ORDER — FLAVOXATE HCL 100 MG PO TABS
100.0000 mg | ORAL_TABLET | Freq: Three times a day (TID) | ORAL | Status: DC | PRN
Start: 2024-02-04 — End: 2024-02-04

## 2024-02-04 MED ORDER — PROPOFOL 500 MG/50ML IV EMUL
INTRAVENOUS | Status: DC | PRN
  Administered 2024-02-04: 150 ug/kg/min via INTRAVENOUS

## 2024-02-04 MED ORDER — PHENYLEPHRINE 80 MCG/ML (10ML) SYRINGE FOR IV PUSH (FOR BLOOD PRESSURE SUPPORT)
PREFILLED_SYRINGE | INTRAVENOUS | Status: DC | PRN
  Administered 2024-02-04: 160 ug via INTRAVENOUS
  Administered 2024-02-04: 120 ug via INTRAVENOUS

## 2024-02-04 MED ORDER — OXYBUTYNIN CHLORIDE 5 MG PO TABS
5.0000 mg | ORAL_TABLET | Freq: Three times a day (TID) | ORAL | Status: DC | PRN
  Administered 2024-02-04 – 2024-02-05 (×3): 5 mg via ORAL
  Filled 2024-02-04 (×3): qty 1

## 2024-02-04 MED ORDER — SUCCINYLCHOLINE CHLORIDE 200 MG/10ML IV SOSY
PREFILLED_SYRINGE | INTRAVENOUS | Status: DC | PRN
  Administered 2024-02-04: 120 mg via INTRAVENOUS

## 2024-02-04 MED ORDER — SODIUM CHLORIDE 0.9 % IV SOLN
10.0000 mL/h | Freq: Once | INTRAVENOUS | Status: AC
  Administered 2024-02-04: 10 mL/h via INTRAVENOUS

## 2024-02-04 MED ORDER — SODIUM CHLORIDE 0.9% IV SOLUTION: Freq: Once | INTRAVENOUS | Status: AC

## 2024-02-04 MED ORDER — LACTATED RINGERS IV SOLN
INTRAVENOUS | Status: AC | PRN
  Administered 2024-02-04: 1000 mL via INTRAVENOUS

## 2024-02-04 NOTE — Progress Notes (Signed)
 PROGRESS NOTE  Jackson Sullivan FMW:[id number] DOB: 07/22/1960 DOA: 02/02/2024 PCP: Patient, No Pcp Per   LOS: 2 days   Brief narrative:  Jackson Sullivan is a 63 y.o. male with past medical history  sciatica, bladder stone with chronic intermittent hematuria, and essential hypertension presented to the emergency department for evaluation for dark-colored stool for 1 week and at the primary care clinic patient was noted to have  guaiac positive stools and hemoglobin of 5.6.  On aspirin daily and Aleve  naproxen  as needed.  In the ED, patient was noted to be hypotensive but hemodynamically otherwise stable.  Hemoglobin is low at 5.1.  CMP showed low bicarb at 21.  Chest x-ray without any infiltrate.  2 units of packed RBC was ordered in the ED.  [facility] Dr. Rollin was consulted and patient was admitted hospital for further evaluation and treatment.       Assessment/Plan: Principal Problem:   GI bleed Active Problems:   Symptomatic anemia   History of sciatica   Bladder stone   Essential hypertension  GI bleeding-melena Symptomatic anemia Initial hemoglobin of 5.1.  Received 2 units of packed RBC initially and was continued on IV Protonix .,   GI was consulted and patient underwent EGD and colonoscopy on 02/04/2024 with findings of nonbleeding gastric ulcers with clean ulcer base which was biopsied.  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  Diverticulosis was also noted in the colon.  GI recommended avoiding NSAIDs.  Hemoglobin today at 7.0.  Will transfuse 1 unit of packed RBC.  GI recommends closer monitoring of hemoglobin.  Diet has been advanced.  Transfuse as necessary.    History of sciatica History of bladder stone with intermittent hematuria Needed In-N-Out cath.  Was Flomax  in the past but not currently on it but has been restarted while in the hospital.  Currently on  condom catheter.   Essential hypertension On IV hydralazine  for now.  Blood pressure seems to be stable.  Not on antihypertensives at home.  DVT prophylaxis: SCDs Start: 02/02/24 2319 Place TED hose Start: 02/02/24 2319   Disposition: Home likely on 02/05/2024 if remains stable.  Status is: Inpatient  Remains inpatient appropriate because: GI bleed, status post EGD and colonoscopy, need for PRBC transfusion    Code Status:     Code Status: Full Code  Family Communication: Spoke with the patient's spouse and brother at bedside on 02/03/2024  Consultants: GI  Procedures: PRBC transfusion 3 units EGD with biopsy on 02/04/2024 Colonoscopy with clip placement on 02/04/2024  Anti-infectives:  None  Anti-infectives (From admission, onward)    None      Subjective: Today, patient was seen and examined at bedside.  Patient seen after endoscopic evaluation.  Patient complains of leaking of urine around the condom catheter.  Denies any pain nausea vomiting fever chills or rigor.   Objective: Vitals:   02/04/24 1015 02/04/24 1036  BP: 133/60 129/82  Pulse: 72 73  Resp: 18 16  Temp:  97.9 F (36.6 C)  SpO2: 99% 98%    Intake/Output Summary (Last 24 hours) at 02/04/2024 1116 Last data filed at 02/04/2024 0934 Gross per 24 hour  Intake 4104.26 ml  Output 350 ml  Net 3754.26 ml   Filed Weights   02/02/24 1956 02/03/24 1357  Weight: 74.8 kg 77 kg   Body mass index is 29.13 kg/m.   Physical Exam:  Body mass index is 29.13 kg/m.  GENERAL: Patient is alert awake and oriented. Not in obvious distress. HENT: Mild pallor noted . Pupils equally reactive to light. Oral mucosa is moist NECK: is supple, no gross swelling noted. CHEST: Clear to auscultation. No crackles or wheezes.   CVS: S1 and S2 heard, no murmur. Regular rate and rhythm.  ABDOMEN: Soft, non-tender, bowel sounds are present.  Condom catheter noted. EXTREMITIES: No edema. CNS: Cranial nerves are intact.  No focal motor deficits. SKIN: warm and dry without rashes.  Data Review: I have personally reviewed the following laboratory data and studies,  CBC: Recent Labs  Lab 02/02/24 2031 02/03/24 0742 02/03/24 1156 02/03/24 1534 02/04/24 0426  WBC 7.7 7.5  --   --  6.9  NEUTROABS 4.2  --   --   --   --   HGB 5.1* 7.5* 7.3* 7.3* 7.0*  HCT 16.3* 23.6* 23.0* 22.3* 22.2*  MCV 92.6 87.7  --   --  87.7  PLT 302 296  --   --  311   Basic Metabolic Panel: Recent Labs  Lab 02/02/24 2031 02/03/24 0742 02/04/24 0426  NA 143 142 141  K 3.8 3.6 3.5  CL 110 109 108  CO2 21* 21* 23  GLUCOSE 110* 106* 92  BUN 17 15 9   CREATININE 1.20 0.95 0.86  CALCIUM 8.5* 8.5* 8.5*  MG  --   --  2.2   Liver Function Tests: Recent Labs  Lab 02/02/24 2031 02/03/24 0742  AST 25 17  ALT 10 9  ALKPHOS 69 70  BILITOT 0.2 0.4  PROT 5.9* 5.7*  ALBUMIN 3.7 3.6   No results for input(s): LIPASE, AMYLASE in the last 168 hours. No results for input(s): AMMONIA in the last 168 hours. Cardiac Enzymes: No results for input(s): CKTOTAL, CKMB, CKMBINDEX, TROPONINI in the last 168 hours. BNP (last 3 results) No results for input(s): BNP in the last 8760 hours.  ProBNP (last 3 results) No results for input(s): PROBNP in the last 8760 hours.  CBG: No results for input(s): GLUCAP in the last 168 hours. No results found for this or any previous visit (from the past 240 hours).   Studies: DG Chest Port 1 View Result Date: 02/02/2024 CLINICAL DATA:  Shortness of breath.  Blurry vision and dizziness. EXAM: PORTABLE CHEST 1 VIEW COMPARISON:  None Available. FINDINGS: The cardiomediastinal contours are normal. The lungs are clear. Pulmonary vasculature is normal. Densities paralleling the lateral chest wall felt to represent extrapleural fat. No significant blunting of costophrenic angles or effusion. No pneumothorax. No acute osseous abnormalities are seen. IMPRESSION: No acute chest findings.  Electronically Signed   By: Andrea Gasman M.D.   On: 02/02/2024 20:49      Vernal Alstrom, MD  [facility] 02/04/2024  If 7PM-7AM, please contact night-coverage

## 2024-02-04 NOTE — Op Note (Signed)
 [facility] Patient Name: Rudolfo Sullivan Procedure Date: 02/04/2024 MRN: [id number] Attending MD: Belvie Just , MD, [id number] Date of Birth: 02-Jul-1960 CSN: [id number] Age: 63 Admit Type: Inpatient Procedure:                Colonoscopy Indications:              Melena Providers:                Belvie Just, MD, Haskel Chris, Technician,                            Robie Breed, RN Referring MD:              Medicines:                General Anesthesia Complications:            No immediate complications. Estimated Blood Loss:     Estimated blood loss: none. Procedure:                Pre-Anesthesia Assessment:                           - Prior to the procedure, a History and Physical                            was performed, and patient medications and                            allergies were reviewed. The patient's tolerance of                            previous anesthesia was also reviewed. The risks                            and benefits of the procedure and the sedation                            options and risks were discussed with the patient.                            All questions were answered, and informed consent                            was obtained. Prior Anticoagulants: The patient has                            taken no anticoagulant or antiplatelet agents. ASA                            Grade Assessment: II - A patient with mild systemic                            disease. After reviewing the risks and benefits,                            the patient was  deemed in satisfactory condition to                            undergo the procedure.                           - Sedation was administered by an anesthesia                            professional. General anesthesia was attained.                           After obtaining informed consent, the colonoscope                            was passed under direct vision. Throughout the                             procedure, the patient's blood pressure, pulse, and                            oxygen saturations were monitored continuously. The                            PCF-HQ190DL ([id number]) colonoscope was introduced                            through the anus and advanced to the the cecum,                            identified by appendiceal orifice and ileocecal                            valve. The colonoscopy was performed without                            difficulty. The patient tolerated the procedure                            well. The quality of the bowel preparation was                            evaluated using the BBPS [facility] Bowel Preparation                            Scale) with scores of: Right Colon = 2 (minor                            amount of residual staining, small fragments of                            stool and/or opaque liquid, but mucosa seen well),  Transverse Colon = 2 (minor amount of residual                            staining, small fragments of stool and/or opaque                            liquid, but mucosa seen well) and Left Colon = 2                            (minor amount of residual staining, small fragments                            of stool and/or opaque liquid, but mucosa seen                            well). The total BBPS score equals 6. The quality                            of the bowel preparation was good. The ileocecal                            valve, appendiceal orifice, and rectum were                            photographed. Scope In: 8:58:33 AM Scope Out: 9:21:12 AM Scope Withdrawal Time: 0 hours 19 minutes 55 seconds  Total Procedure Duration: 0 hours 22 minutes 39 seconds  Findings:      Two large localized angiodysplastic lesions without bleeding were found       in the ascending colon and in the cecum. Coagulation for tissue       destruction using monopolar probe was successful. To  stop active       bleeding, six hemostatic clips were successfully placed (MR safe). Clip       manufacturer: [employer]. There was no bleeding at the end of the       procedure.      Scattered large-mouthed, medium-mouthed and small-mouthed diverticula       were found in the sigmoid colon and descending colon.      In the cecum and the cecal cap two large nonbleeding AVMs were       identified. Application of APC to the cecal AVM was successful. Minor       bleeding was precipitated, but it was completely ablated. Because of       multiple applications of APC, to prevent a delayed perforation, three       hemoclips were secured onto the site. The cecal cap AVM was treated with       APC, but significant rapid oozing persisted. Further application of the       APC did not arrest the bleeding. Three hemoclips were placed with a       complete arrest of the bleeding with the first hemoclip. From the       current findings, the patient's anemia may be from the AVMs. Impression:               - Two non-bleeding colonic angiodysplastic lesions.  Treated with a monopolar probe. Clip manufacturer:                            [employer]. Clips (MR safe) were placed.                           - Diverticulosis in the sigmoid colon and in the                            descending colon.                           - No specimens collected. Moderate Sedation:      Not Applicable - Patient had care per Anesthesia. Recommendation:           - Return patient to hospital ward for ongoing care.                           - Resume regular diet.                           - Return to GI clinic in 2 weeks.                           - Avoid NSAIDS.                           - Follow HGB to ensure stability. Procedure Code(s):        --- Professional ---                           [id number], Colonoscopy, flexible; with ablation of                            tumor(s), polyp(s),  or other lesion(s) (includes                            pre- and post-dilation and guide wire passage, when                            performed) Diagnosis Code(s):        --- Professional ---                           K55.20, Angiodysplasia of colon without hemorrhage                           K92.1, Melena (includes Hematochezia)                           K57.30, Diverticulosis of large intestine without                            perforation or abscess without bleeding CPT copyright 2022 American Medical Association. All rights reserved. The codes documented in this report are preliminary and upon coder review may  be revised to  meet current compliance requirements. Belvie Just, MD Belvie Just, MD 02/04/2024 9:39:09 AM This report has been signed electronically. Number of Addenda: 0

## 2024-02-04 NOTE — Anesthesia Postprocedure Evaluation (Addendum)
 Anesthesia Post Note  Patient: Jackson Sullivan  Procedure(s) Performed: COLONOSCOPY EGD (ESOPHAGOGASTRODUODENOSCOPY)     Patient location during evaluation: PACU Anesthesia Type: MAC Level of consciousness: awake and alert Pain management: pain level controlled Vital Signs Assessment: post-procedure vital signs reviewed and stable Respiratory status: spontaneous breathing, nonlabored ventilation and respiratory function stable Cardiovascular status: blood pressure returned to baseline Postop Assessment: no apparent nausea or vomiting Anesthetic complications: no Comments: 1u pRBCs started in PACU. This was communicated to proceduralist.   No notable events documented.  Last Vitals:  Vitals:   02/04/24 1015 02/04/24 1036  BP: 133/60 129/82  Pulse: 72 73  Resp: 18 16  Temp:  36.6 C  SpO2: 99% 98%    Last Pain:  Vitals:   02/04/24 1036  TempSrc: Oral  PainSc:                  Vertell Row

## 2024-02-04 NOTE — Plan of Care (Signed)
   Problem: Clinical Measurements: Goal: Ability to maintain clinical measurements within normal limits will improve Outcome: Progressing Goal: Diagnostic test results will improve Outcome: Progressing Goal: Respiratory complications will improve Outcome: Progressing Goal: Cardiovascular complication will be avoided Outcome: Progressing

## 2024-02-04 NOTE — Transfer of Care (Signed)
 Immediate Anesthesia Transfer of Care Note  Patient: Jackson Sullivan  Procedure(s) Performed: COLONOSCOPY EGD (ESOPHAGOGASTRODUODENOSCOPY)  Patient Location: PACU  Anesthesia Type:General  Level of Consciousness: awake and patient cooperative  Airway & Oxygen Therapy: Patient Spontanous Breathing and Patient connected to face mask  Post-op Assessment: Report given to RN and Post -op Vital signs reviewed and stable  Post vital signs: Reviewed and stable  Last Vitals:  Vitals Value Taken Time  BP    Temp    Pulse 95 02/04/24 09:34  Resp 36 02/04/24 09:34  SpO2 96 % 02/04/24 09:34  Vitals shown include unfiled device data.  Last Pain:  Vitals:   02/04/24 0820  TempSrc: Temporal  PainSc: 0-No pain         Complications: No notable events documented.

## 2024-02-04 NOTE — Op Note (Signed)
 [facility] Patient Name: Jackson Sullivan Procedure Date: 02/04/2024 MRN: [id number] Attending MD: Belvie Just , MD, [id number] Date of Birth: 12/06/1960 CSN: [id number] Age: 63 Admit Type: Inpatient Procedure:                Upper GI endoscopy Indications:              Iron deficiency anemia Providers:                Belvie Just, MD, Haskel Chris, Technician,                            Robie Breed, RN Referring MD:              Medicines:                General Anesthesia Complications:            No immediate complications. Estimated Blood Loss:     Estimated blood loss was minimal. Procedure:                Pre-Anesthesia Assessment:                           - Prior to the procedure, a History and Physical                            was performed, and patient medications and                            allergies were reviewed. The patient's tolerance of                            previous anesthesia was also reviewed. The risks                            and benefits of the procedure and the sedation                            options and risks were discussed with the patient.                            All questions were answered, and informed consent                            was obtained. Prior Anticoagulants: The patient has                            taken no anticoagulant or antiplatelet agents. ASA                            Grade Assessment: II - A patient with mild systemic                            disease. After reviewing the risks and benefits,  the patient was deemed in satisfactory condition to                            undergo the procedure.                           - Sedation was administered by an anesthesia                            professional. General anesthesia was attained.                           After obtaining informed consent, the endoscope was                            passed under direct  vision. Throughout the                            procedure, the patient's blood pressure, pulse, and                            oxygen saturations were monitored continuously. The                            PCF-HQ190DL ([id number]) colonoscope was introduced                            through the mouth, and advanced to the second part                            of duodenum. The upper GI endoscopy was technically                            difficult and complex due to the patient's oxygen                            desaturation. Successful completion of the                            procedure was aided by treating with ventilation.                            The patient tolerated the procedure well. Scope In: Scope Out: Findings:      A 3 cm hiatal hernia was present.      Many non-bleeding superficial gastric ulcers with a clean ulcer base       (Forrest Class III) were found in the gastric antrum. The largest lesion       was 5 mm in largest dimension. Biopsies were taken with a cold forceps       for Helicobacter pylori testing.      The examined duodenum was normal.      During the EGD the patient suffered a significant desaturation. He       required positive airway ventilation and he responded well. The decision  was made to intubated the patient. The reason for his desturation was       most likely a combination of oral secretions, coughing, and obstructive       apnea. In the gastric antrum multiple superficial ulcerations were       identified and biopsied. Impression:               - 3 cm hiatal hernia.                           - Non-bleeding gastric ulcers with a clean ulcer                            base (Forrest Class III). Biopsied.                           - Normal examined duodenum. Moderate Sedation:      Not Applicable - Patient had care per Anesthesia. Recommendation:           - Return patient to hospital ward for ongoing care.                           -  Resume regular diet.                           - Continue present medications.                           - Await pathology results.                           - Return to GI clinic in 4 weeks. Procedure Code(s):        --- Professional ---                           [id number], Esophagogastroduodenoscopy, flexible,                            transoral; with biopsy, single or multiple Diagnosis Code(s):        --- Professional ---                           K25.9, Gastric ulcer, unspecified as acute or                            chronic, without hemorrhage or perforation                           K44.9, Diaphragmatic hernia without obstruction or                            gangrene                           D50.9, Iron deficiency anemia, unspecified CPT copyright 2022 American Medical Association. All rights reserved. The codes documented in this report are preliminary and upon coder review may  be revised to meet current compliance requirements.  Belvie Just, MD Belvie Just, MD 02/04/2024 9:27:35 AM This report has been signed electronically. Number of Addenda: 0

## 2024-02-04 NOTE — Anesthesia Procedure Notes (Signed)
 Procedure Name: Intubation Date/Time: 02/04/2024 8:48 AM  Performed by: Judythe Tanda Aran, CRNAPre-anesthesia Checklist: Patient identified, Emergency Drugs available, Suction available and Patient being monitored Patient Re-evaluated:Patient Re-evaluated prior to induction Oxygen Delivery Method: Circle system utilized Preoxygenation: Pre-oxygenation with 100% oxygen Induction Type: IV induction and Rapid sequence Ventilation: Two handed mask ventilation required Laryngoscope Size: Mac and 4 Grade View: Grade I Tube type: Oral Tube size: 7.5 mm Number of attempts: 1 Airway Equipment and Method: Stylet Placement Confirmation: ETT inserted through vocal cords under direct vision, positive ETCO2 and breath sounds checked- equal and bilateral Secured at: 23 cm Tube secured with: Tape Dental Injury: Teeth and Oropharynx as per pre-operative assessment

## 2024-02-04 NOTE — Progress Notes (Signed)
 Pt states difficulty urinating, pain with bladder spasms, and pain/burning with urination. Reports a history of kidney stones. Bladder scanner reads . MD made aware. Order for I&O cath placed. Upon entering room to I&O cath, pt asked to attempt to void. Pt was able to void on his own, post-void bladder scanner reads . Pt requests to wait to I&O cath at this time and see if he can continue to void on his own. MD made aware. PRN orders placed for pain.

## 2024-02-04 NOTE — Anesthesia Procedure Notes (Signed)
 Procedure Name: MAC Date/Time: 02/04/2024 8:35 AM  Performed by: Judythe Tanda Aran, CRNAPre-anesthesia Checklist: Patient identified, Emergency Drugs available, Suction available and Patient being monitored Patient Re-evaluated:Patient Re-evaluated prior to induction Oxygen Delivery Method: Simple face mask

## 2024-02-04 NOTE — Addendum Note (Signed)
 Addendum  created 02/04/24 1139 by Paul Lamarr BRAVO, MD   Clinical Note Signed

## 2024-02-05 ENCOUNTER — Other Ambulatory Visit (HOSPITAL_COMMUNITY): Payer: Self-pay

## 2024-02-05 LAB — HEMOGLOBIN AND HEMATOCRIT, BLOOD
HCT: 25.8 % — ABNORMAL LOW (ref 39.0–52.0)
Hemoglobin: 8.5 g/dL — ABNORMAL LOW (ref 13.0–17.0)

## 2024-02-05 MED ORDER — FOLIC ACID 1 MG PO TABS: 1.0000 mg | ORAL_TABLET | Freq: Every day | ORAL | 0 refills | Status: AC

## 2024-02-05 MED ORDER — OXYBUTYNIN CHLORIDE 5 MG PO TABS: 5.0000 mg | ORAL_TABLET | Freq: Three times a day (TID) | ORAL | 0 refills | Status: AC | PRN

## 2024-02-05 MED ORDER — PANTOPRAZOLE SODIUM 40 MG PO TBEC
40.0000 mg | DELAYED_RELEASE_TABLET | Freq: Two times a day (BID) | ORAL | 1 refills | Status: DC
  Filled 2024-02-05: qty 30, 15d supply, fill #0

## 2024-02-05 MED ORDER — FOLIC ACID 1 MG PO TABS
1.0000 mg | ORAL_TABLET | Freq: Every day | ORAL | Status: DC
  Administered 2024-02-05: 1 mg via ORAL
  Filled 2024-02-05: qty 1

## 2024-02-05 MED ORDER — TAMSULOSIN HCL 0.4 MG PO CAPS: 0.4000 mg | ORAL_CAPSULE | Freq: Every day | ORAL | 1 refills | Status: AC

## 2024-02-05 MED ORDER — FERROUS SULFATE 325 (65 FE) MG PO TABS: 325.0000 mg | ORAL_TABLET | Freq: Every day | ORAL | 3 refills | Status: AC

## 2024-02-05 MED ORDER — AMLODIPINE BESYLATE 5 MG PO TABS: 5.0000 mg | ORAL_TABLET | Freq: Every day | ORAL | 1 refills | Status: AC

## 2024-02-05 MED ORDER — CEPHALEXIN 500 MG PO CAPS
500.0000 mg | ORAL_CAPSULE | Freq: Three times a day (TID) | ORAL | 0 refills | Status: DC
Start: 2024-02-05 — End: 2024-02-05
  Filled 2024-02-05: qty 9, 3d supply, fill #0

## 2024-02-05 MED ORDER — OXYBUTYNIN CHLORIDE 5 MG PO TABS
5.0000 mg | ORAL_TABLET | Freq: Three times a day (TID) | ORAL | 0 refills | Status: DC | PRN
  Filled 2024-02-05: qty 30, 10d supply, fill #0

## 2024-02-05 MED ORDER — CEPHALEXIN 500 MG PO CAPS: 500.0000 mg | ORAL_CAPSULE | Freq: Three times a day (TID) | ORAL | Status: DC

## 2024-02-05 MED ORDER — TAMSULOSIN HCL 0.4 MG PO CAPS
0.4000 mg | ORAL_CAPSULE | Freq: Every day | ORAL | 1 refills | Status: DC
  Filled 2024-02-05: qty 30, 30d supply, fill #0

## 2024-02-05 MED ORDER — FOLIC ACID 1 MG PO TABS
1.0000 mg | ORAL_TABLET | Freq: Every day | ORAL | 0 refills | Status: DC
  Filled 2024-02-05: qty 30, 30d supply, fill #0

## 2024-02-05 MED ORDER — FERROUS SULFATE 325 (65 FE) MG PO TABS
325.0000 mg | ORAL_TABLET | Freq: Every day | ORAL | 3 refills | Status: DC
  Filled 2024-02-05: qty 30, 30d supply, fill #0

## 2024-02-05 MED ORDER — CEPHALEXIN 500 MG PO CAPS: 500.0000 mg | ORAL_CAPSULE | Freq: Three times a day (TID) | ORAL | 0 refills | Status: AC

## 2024-02-05 MED ORDER — FERROUS SULFATE 325 (65 FE) MG PO TABS
325.0000 mg | ORAL_TABLET | Freq: Every day | ORAL | Status: DC
  Administered 2024-02-05: 325 mg via ORAL
  Filled 2024-02-05: qty 1

## 2024-02-05 MED ORDER — AMLODIPINE BESYLATE 5 MG PO TABS
5.0000 mg | ORAL_TABLET | Freq: Every day | ORAL | 1 refills | Status: DC
  Filled 2024-02-05: qty 30, 30d supply, fill #0

## 2024-02-05 MED ORDER — AMLODIPINE BESYLATE 10 MG PO TABS
5.0000 mg | ORAL_TABLET | Freq: Every day | ORAL | Status: DC
  Administered 2024-02-05: 5 mg via ORAL
  Filled 2024-02-05: qty 1

## 2024-02-05 MED ORDER — PANTOPRAZOLE SODIUM 40 MG PO TBEC: 40.0000 mg | DELAYED_RELEASE_TABLET | Freq: Two times a day (BID) | ORAL | 1 refills | Status: AC

## 2024-02-05 NOTE — Plan of Care (Signed)
  Problem: Clinical Measurements: Goal: Ability to maintain clinical measurements within normal limits will improve Outcome: Progressing Goal: Diagnostic test results will improve Outcome: Progressing   Problem: Elimination: Goal: Will not experience complications related to bowel motility Outcome: Progressing Goal: Will not experience complications related to urinary retention Outcome: Progressing   Problem: Pain Managment: Goal: General experience of comfort will improve and/or be controlled Outcome: Progressing

## 2024-02-05 NOTE — Progress Notes (Signed)
 AVS reviewed w/ patient who verbalized an understanding. AMN interpretor refused by pt. Pt's daughter will be here at 43 - AVS printed in [place] and [place]. PIV removed as noted. Pt's daughter bringing clothes at 1100. Central tel notified of pt discharge- tele removed from pt- box returned to main desk.

## 2024-02-05 NOTE — Discharge Instructions (Signed)
 No tome  Ibuprofen, Aleve , Naproxeno por que causan [place], y [place] and [place] riesgo de sangrado.   Tiene un medicamento nuevo para la presion [place]: Norvasc .   Tiene un medicamento para sanar las ulceras: Protonix .   \

## 2024-02-05 NOTE — Discharge Summary (Signed)
 Physician Discharge Summary   Patient: Jackson Sullivan MRN: [id number] DOB: 06-17-1960  Admit date:     02/02/2024  Discharge date: 02/05/24  Discharge Physician: Owen DELENA Lore   PCP: Patient, No Pcp Per   Recommendations at discharge:    Needs CBC to follow Hb Needs Follow up on BP , adjust medications as needed.  Needs follow up Biopsy results.   Discharge Diagnoses: Principal Problem:   GI bleed Active Problems:   Symptomatic anemia   History of sciatica   Bladder stone   Essential hypertension  Resolved Problems:   * No resolved hospital problems. *  Hospital Course: Heaton CREED KAIL is a 63 y.o. male with past medical history  sciatica, bladder stone with chronic intermittent hematuria, and essential hypertension presented to the emergency department for evaluation for dark-colored stool for 1 week and at the primary care clinic patient was noted to have  guaiac positive stools and hemoglobin of 5.6.  On aspirin daily and Aleve  naproxen  as needed.  In the ED, patient was noted to be hypotensive but hemodynamically otherwise stable.  Hemoglobin is low at 5.1.  CMP showed low bicarb at 21.  Chest x-ray without any infiltrate.  2 units of packed RBC was ordered in the ED.  [facility] Dr. Rollin was consulted and patient was admitted hospital for further evaluation and treatment.    Assessment and Plan: GI bleeding-melena Symptomatic anemia Initial hemoglobin of 5.1.  Received 2 units of packed RBC initially and was continued on IV Protonix .,   GI was consulted.  -Patient underwent EGD and colonoscopy on 02/04/2024 with findings of nonbleeding gastric ulcers with clean ulcer base which was biopsied.  - Colonoscopy showed 2 angiodysplastic lesions without bleeding in the ascending colon and cecum which was treated with monopolar probe and clips were placed in.  Diverticulosis was also noted in the colon.  GI recommended avoiding NSAIDs.   -Required third unit PRBC 8/31 for hb 7.   Hb today at 8.  Tolerating diet. Stable for discharge on PPI BID     History of sciatica History of bladder stone with intermittent hematuria Needed In-N-Out cath.  Was Flomax  in the past but not currently on it but has been restarted while in the hospital.   Having urine out put.  Discharge on Flomax .  Urine culture growing 40,000 colonies bacteria, report dysuria. Will discharge on KKeflex empirically.  Needs to follow up with urology.   Essential hypertension On IV hydralazine  for now.  Blood pressure seems to be stable.  Not on antihypertensives at home.          Consultants: GI Procedures performed: Endoscopy  Disposition: Home Diet recommendation:  Discharge Diet Orders (From admission, onward)     Start     Ordered   02/05/24 0000  Diet - low sodium heart healthy        02/05/24 0938           Cardiac diet DISCHARGE MEDICATION: Allergies as of 02/05/2024   No Known Allergies      Medication List     STOP taking these medications    aspirin EC 81 MG tablet   HYDROcodone -acetaminophen  5-325 MG tablet Commonly known as: NORCO/VICODIN   naproxen  sodium 220 MG tablet Commonly known as: ALEVE    phenazopyridine  200 MG tablet Commonly known as: PYRIDIUM        TAKE these medications    amLODipine  5 MG tablet Commonly known as: NORVASC  Take 1 tablet (5 mg  total) by mouth daily.   cephALEXin  500 MG capsule Commonly known as: KEFLEX  Take 1 capsule (500 mg total) by mouth every 8 (eight) hours for 3 days.   ferrous sulfate  325 (65 FE) MG tablet Take 1 tablet (325 mg total) by mouth daily with breakfast.   folic acid  1 MG tablet Commonly known as: FOLVITE  Take 1 tablet (1 mg total) by mouth daily.   oxybutynin  5 MG tablet Commonly known as: DITROPAN  Take 1 tablet (5 mg total) by mouth every 8 (eight) hours as needed for up to 10 days for bladder spasms.   pantoprazole  40 MG tablet Commonly known as: Protonix  Take 1 tablet (40 mg total) by  mouth 2 (two) times daily.   tamsulosin  0.4 MG Caps capsule Commonly known as: Flomax  Take 1 capsule (0.4 mg total) by mouth daily.        Follow-up Information     Rollin Dover, MD Follow up in 4 week(s).   Specialty: Gastroenterology Contact information: [address] [place] [address] [phone]                Discharge Exam: Fredricka Weights   02/02/24 1956 02/03/24 1357  Weight: 74.8 kg 77 kg   General; NAD  Condition at discharge: stable  The results of significant diagnostics from this hospitalization (including imaging, microbiology, ancillary and laboratory) are listed below for reference.   Imaging Studies: DG Chest Port 1 View Result Date: 02/02/2024 CLINICAL DATA:  Shortness of breath.  Blurry vision and dizziness. EXAM: PORTABLE CHEST 1 VIEW COMPARISON:  None Available. FINDINGS: The cardiomediastinal contours are normal. The lungs are clear. Pulmonary vasculature is normal. Densities paralleling the lateral chest wall felt to represent extrapleural fat. No significant blunting of costophrenic angles or effusion. No pneumothorax. No acute osseous abnormalities are seen. IMPRESSION: No acute chest findings. Electronically Signed   By: Andrea Gasman M.D.   On: 02/02/2024 20:49    Microbiology: Results for orders placed or performed during the hospital encounter of 02/02/24  Urine Culture     Status: Abnormal (Preliminary result)   Collection Time: 02/02/24 11:41 PM   Specimen: Urine, Random  Result Value Ref Range Status   Specimen Description   Final    URINE, RANDOM Performed at [facility], [address] [place], [place] [address]    Special Requests   Final    NONE Reflexed from [id number] Performed at [facility], [address] [place], [place] [address]    Culture (A)  Final    40,000 COLONIES/mL CITROBACTER KOSERI 10,000 COLONIES/mL ENTEROCOCCUS FAECALIS    Report Status  PENDING  Incomplete    Labs: CBC: Recent Labs  Lab 02/02/24 2031 02/03/24 0742 02/03/24 1156 02/03/24 1534 02/04/24 0426 02/04/24 1646 02/05/24 0424  WBC 7.7 7.5  --   --  6.9  --   --   NEUTROABS 4.2  --   --   --   --   --   --   HGB 5.1* 7.5* 7.3* 7.3* 7.0* 8.4* 8.5*  HCT 16.3* 23.6* 23.0* 22.3* 22.2* 26.3* 25.8*  MCV 92.6 87.7  --   --  87.7  --   --   PLT 302 296  --   --  311  --   --    Basic Metabolic Panel: Recent Labs  Lab 02/02/24 2031 02/03/24 0742 02/04/24 0426  NA 143 142 141  K 3.8 3.6 3.5  CL 110 109 108  CO2 21* 21* 23  GLUCOSE 110* 106* 92  BUN 17 15 9   CREATININE 1.20 0.95 0.86  CALCIUM 8.5* 8.5* 8.5*  MG  --   --  2.2   Liver Function Tests: Recent Labs  Lab 02/02/24 2031 02/03/24 0742  AST 25 17  ALT 10 9  ALKPHOS 69 70  BILITOT 0.2 0.4  PROT 5.9* 5.7*  ALBUMIN 3.7 3.6   CBG: No results for input(s): GLUCAP in the last 168 hours.  Discharge time spent: greater than 30 minutes.  Signed: Owen DELENA Lore, MD [facility] 02/05/2024

## 2024-02-05 NOTE — Progress Notes (Signed)
 Subjective: No reports of bleeding.  Feeling well.  Objective: Vital signs in last 24 hours: Temp:  [97.9 F (36.6 C)-98.8 F (37.1 C)] 98.5 F (36.9 C) (09/01 0635) Pulse Rate:  [66-95] 76 (09/01 0635) Resp:  [15-29] 20 (09/01 0635) BP: (119-189)/(51-103) 169/82 (09/01 0635) SpO2:  [92 %-100 %] 99 % (09/01 0635) Last BM Date : 02/04/24  Intake/Output from previous day: 08/31 0701 - 09/01 0700 In: 1275.5 [P.O.:120; I.V.:818; Blood:337.5] Out: 0  Intake/Output this shift: No intake/output data recorded.  General appearance: alert and no distress GI: soft, non-tender; bowel sounds normal; no masses,  no organomegaly  Lab Results: Recent Labs    02/02/24 2031 02/03/24 0742 02/03/24 1156 02/04/24 0426 02/04/24 1646 02/05/24 0424  WBC 7.7 7.5  --  6.9  --   --   HGB 5.1* 7.5*   < > 7.0* 8.4* 8.5*  HCT 16.3* 23.6*   < > 22.2* 26.3* 25.8*  PLT 302 296  --  311  --   --    < > = values in this interval not displayed.   BMET Recent Labs    02/02/24 2031 02/03/24 0742 02/04/24 0426  NA 143 142 141  K 3.8 3.6 3.5  CL 110 109 108  CO2 21* 21* 23  GLUCOSE 110* 106* 92  BUN 17 15 9   CREATININE 1.20 0.95 0.86  CALCIUM 8.5* 8.5* 8.5*   LFT Recent Labs    02/03/24 0742  PROT 5.7*  ALBUMIN 3.6  AST 17  ALT 9  ALKPHOS 70  BILITOT 0.4   PT/INR Recent Labs    02/02/24 2031 02/03/24 0742  LABPROT 13.0 12.9  INR 0.9 0.9   Hepatitis Panel No results for input(s): HEPBSAG, HCVAB, HEPAIGM, HEPBIGM in the last 72 hours. C-Diff No results for input(s): CDIFFTOX in the last 72 hours. Fecal Lactopherrin No results for input(s): FECLLACTOFRN in the last 72 hours.  Studies/Results: No results found.  Medications: Scheduled:  pantoprazole  (PROTONIX ) IV  40 mg Intravenous Q12H   sodium chloride  flush  3 mL Intravenous Q12H   tamsulosin   0.4 mg Oral QPC supper   Continuous:  Assessment/Plan: 1) Anemia. 2) Gastric ulcers - probable  NSAID-induced. 3) Two large proximal colon AVMs.   The patient is stable.  His HGB is at 8.5 g/dL.  The patient can be discharged home safely with follow up in the office.  Plan: 1) Okay for D/C. 2) D/C with pantoprazole  40 mg BID x 1 month. 3) Follow up in the office in 1-2 weeks.  LOS: 3 days   Jackson Sullivan D 02/05/2024, 6:43 AM

## 2024-02-06 ENCOUNTER — Encounter (HOSPITAL_COMMUNITY): Payer: Self-pay | Admitting: Gastroenterology

## 2024-02-06 LAB — BPAM RBC
Blood Product Expiration Date: 202509212359
Blood Product Expiration Date: 202509252359
Blood Product Expiration Date: 202509252359
Blood Product Expiration Date: 202509262359
ISSUE DATE / TIME: 202508292231
ISSUE DATE / TIME: 202508300218
ISSUE DATE / TIME: 202508310937
ISSUE DATE / TIME: 202509020939
Unit Type and Rh: 202509212359
Unit Type and Rh: 6200
Unit Type and Rh: 6200
Unit Type and Rh: 6200
Unit Type and Rh: 6200

## 2024-02-06 LAB — TYPE AND SCREEN
ABO/RH(D): A POS
Antibody Screen: NEGATIVE
Unit division: 0
Unit division: 0
Unit division: 0
Unit division: 0

## 2024-02-06 LAB — URINE CULTURE: Culture: 40000 — AB

## 2024-02-09 LAB — SURGICAL PATHOLOGY
